# Patient Record
Sex: Female | Born: 1965 | Race: Black or African American | Hispanic: No | Marital: Single | Smoking: Never smoker
Health system: Southern US, Community
[De-identification: ages and names within clinical notes are randomized; demographics above are authoritative.]

## PROBLEM LIST (undated history)

## (undated) DIAGNOSIS — N390 Urinary tract infection, site not specified: Secondary | ICD-10-CM

## (undated) DIAGNOSIS — R519 Headache, unspecified: Secondary | ICD-10-CM

## (undated) DIAGNOSIS — R51 Headache: Secondary | ICD-10-CM

## (undated) DIAGNOSIS — D649 Anemia, unspecified: Secondary | ICD-10-CM

## (undated) HISTORY — PX: BREAST SURGERY: SHX581

## (undated) HISTORY — PX: MOUTH SURGERY: SHX715

---

## 2013-04-11 ENCOUNTER — Emergency Department (HOSPITAL_COMMUNITY)
Admission: EM | Admit: 2013-04-11 | Discharge: 2013-04-11 | Disposition: A | Discharge status: Home / Self Care | Payer: BC Managed Care – PPO | Source: Home / Self Care | Attending: Family Medicine | Admitting: Family Medicine

## 2013-04-11 ENCOUNTER — Encounter (HOSPITAL_COMMUNITY): Payer: Self-pay | Admitting: Emergency Medicine

## 2013-04-11 DIAGNOSIS — N390 Urinary tract infection, site not specified: Secondary | ICD-10-CM

## 2013-04-11 LAB — POCT URINALYSIS DIP (DEVICE)
Bilirubin Urine: NEGATIVE
Glucose, UA: NEGATIVE mg/dL
Ketones, ur: NEGATIVE mg/dL
Nitrite: NEGATIVE
Protein, ur: >=300 mg/dL — AB
Specific Gravity, Urine: >=1.03 (ref 1.005–1.030)
Urobilinogen, UA: 0.2 mg/dL (ref 0.0–1.0)
pH: 7 (ref 5.0–8.0)

## 2013-04-11 MED ORDER — CEPHALEXIN 500 MG PO CAPS: 500.0000 mg | ORAL_CAPSULE | Freq: Four times a day (QID) | ORAL | Status: DC

## 2013-04-11 NOTE — ED Provider Notes (Signed)
CSN: [id number]     Arrival date & time 04/11/13  1836 History   First MD Initiated Contact with Patient 04/11/13 1949     Chief Complaint  Patient presents with  . Urinary Tract Infection    frequency. odor. pain. since yesterday evening.    (Consider location/radiation/quality/duration/timing/severity/associated sxs/prior Treatment) HPI Comments: Pt reports lmp was 1 week ago.   Patient is a 47 y.o. female presenting with urinary tract infection. The history is provided by the patient.  Urinary Tract Infection This is a new problem. The current episode started yesterday. Episode frequency: with urination. The problem has been gradually worsening. Associated symptoms include abdominal pain. Nothing aggravates the symptoms. Nothing relieves the symptoms. Treatments tried: otc azo. The treatment provided no relief.    History reviewed. No pertinent past medical history. History reviewed. No pertinent past surgical history. History reviewed. No pertinent family history. History  Substance Use Topics  . Smoking status: Never Smoker   . Smokeless tobacco: Not on file  . Alcohol Use: Yes   OB History   Grav Para Term Preterm Abortions TAB SAB Ect Mult Living                 Review of Systems  Constitutional: Negative for fever and chills.  Gastrointestinal: Positive for abdominal pain. Negative for nausea and vomiting.  Genitourinary: Positive for dysuria. Negative for hematuria, flank pain and vaginal discharge.    Allergies  Review of patient's allergies indicates no known allergies.  Home Medications   Current Outpatient Rx  Name  Route  Sig  Dispense  Refill  . cephALEXin (KEFLEX) 500 MG capsule   Oral   Take 1 capsule (500 mg total) by mouth 4 (four) times daily.   28 capsule   0    BP 137/90  Pulse 69  Temp(Src) 98.1 F (36.7 C) (Oral)  Resp 14  SpO2 100%  LMP 04/04/2013 Physical Exam  Constitutional: She appears well-developed and well-nourished. No  distress.  Cardiovascular: Normal rate and regular rhythm.   Pulmonary/Chest: Effort normal and breath sounds normal.  Abdominal: Normal appearance and bowel sounds are normal. There is tenderness in the suprapubic area. There is no rigidity, no rebound, no guarding and no CVA tenderness.    ED Course  Procedures (including critical care time) Labs Review Labs Reviewed  POCT URINALYSIS DIP (DEVICE) - Abnormal; Notable for the following:    Hgb urine dipstick MODERATE (*)    Protein, ur >=300 (*)    Leukocytes, UA SMALL (*)    All other components within normal limits  URINE CULTURE   Imaging Review No results found.  MDM   1. UTI (lower urinary tract infection)   pt reports remote hx of uti and requiring more than one course of antibiotics to cure. Ordered urine culture. Rx keflex 500mg  QID for 7 days.      Cathlyn Parsons, NP 04/11/13 [phone]

## 2013-04-11 NOTE — ED Notes (Signed)
C/o uti symptoms. Pain. Frequency. And odor. Onset yesterday evening. Pt has taken azo with no relief.  Denies fever and any other symptoms.

## 2013-04-13 NOTE — ED Provider Notes (Signed)
Medical screening examination/treatment/procedure(s) were performed by resident physician or non-physician practitioner and as supervising physician I was immediately available for consultation/collaboration.   Lorayne Getchell DOUGLAS MD.   Despina Boan D Britany Callicott, MD 04/13/13 1516 

## 2013-05-09 NOTE — ED Notes (Signed)
Patient called , asking for a second round of her antibiotics , as she always needs a 2nd round, the first never works by itself; states she informed the NP of this when she was here ; spoke w Dr Artis Flock , who wished to advise pt to be rechecked or see her regular MD

## 2013-05-16 ENCOUNTER — Emergency Department (HOSPITAL_COMMUNITY)
Admission: EM | Admit: 2013-05-16 | Discharge: 2013-05-16 | Disposition: A | Discharge status: Home / Self Care | Payer: BC Managed Care – PPO | Source: Home / Self Care

## 2013-05-16 ENCOUNTER — Encounter (HOSPITAL_COMMUNITY): Payer: Self-pay | Admitting: Emergency Medicine

## 2013-05-16 DIAGNOSIS — N390 Urinary tract infection, site not specified: Secondary | ICD-10-CM

## 2013-05-16 HISTORY — DX: Urinary tract infection, site not specified: N39.0

## 2013-05-16 LAB — POCT URINALYSIS DIP (DEVICE)
Bilirubin Urine: NEGATIVE
Glucose, UA: NEGATIVE mg/dL
Ketones, ur: NEGATIVE mg/dL
Leukocytes, UA: NEGATIVE
Nitrite: NEGATIVE
Protein, ur: 30 mg/dL — AB
Specific Gravity, Urine: >=1.03 (ref 1.005–1.030)
Urobilinogen, UA: 0.2 mg/dL (ref 0.0–1.0)
pH: 6.5 (ref 5.0–8.0)

## 2013-05-16 MED ORDER — NITROFURANTOIN MONOHYD MACRO 100 MG PO CAPS: 100.0000 mg | ORAL_CAPSULE | Freq: Two times a day (BID) | ORAL | Status: DC

## 2013-05-16 NOTE — ED Provider Notes (Signed)
CSN: [id number]     Arrival date & time 05/16/13  1849 History   None    Chief Complaint  Patient presents with  . Urinary Tract Infection   (Consider location/radiation/quality/duration/timing/severity/associated sxs/prior Treatment) HPI Comments: 47 year old female presents complaining of possible urinary tract infection. She was seen here for this one month ago. She was prescribed Keflex which did help at that time, but the symptoms returned after her course of Keflex. She has burning, urinary frequency, and foul odor to her urine. She has been taking over-the-counter AZO which is helpful. She also has been pushing by mouth fluids which she thinks helps as well. She denies fever, chills, abdominal pain, flank pain. No vaginal discharge or risk factors for STDs. She states that her previous visit, she told the provider of the past she always has to take antibiotics for 2 weeks or the symptoms will return. She has a long history of this. This is been evaluated with ultrasounds as well as voiding cystourethrogram, no causes been found. She was told at the time that she would feel to call back for more antibiotics if she was not better, however, she was denied when she called.  Patient is a 47 y.o. female presenting with urinary tract infection.  Urinary Tract Infection Pertinent negatives include no chest pain, no abdominal pain and no shortness of breath.    Past Medical History  Diagnosis Date  . UTI (lower urinary tract infection)    History reviewed. No pertinent past surgical history. No family history on file. History  Substance Use Topics  . Smoking status: Never Smoker   . Smokeless tobacco: Not on file  . Alcohol Use: 0.0 oz/week    7-14 Glasses of wine per week   OB History   Grav Para Term Preterm Abortions TAB SAB Ect Mult Living                 Review of Systems  Constitutional: Negative for fever and chills.  Eyes: Negative for visual disturbance.  Respiratory:  Negative for cough and shortness of breath.   Cardiovascular: Negative for chest pain, palpitations and leg swelling.  Gastrointestinal: Negative for nausea, vomiting and abdominal pain.  Endocrine: Negative for polydipsia and polyuria.  Genitourinary: Positive for dysuria and frequency. Negative for urgency.       Malodorous urine  Musculoskeletal: Negative for arthralgias and myalgias.  Skin: Negative for rash.  Neurological: Negative for dizziness, weakness and light-headedness.    Allergies  Review of patient's allergies indicates no known allergies.  Home Medications   Current Outpatient Rx  Name  Route  Sig  Dispense  Refill  . cephALEXin (KEFLEX) 500 MG capsule   Oral   Take 1 capsule (500 mg total) by mouth 4 (four) times daily.   28 capsule   0   . nitrofurantoin, macrocrystal-monohydrate, (MACROBID) 100 MG capsule   Oral   Take 1 capsule (100 mg total) by mouth 2 (two) times daily.   28 capsule   0    BP 149/100  Pulse 73  Temp(Src) 98.1 F (36.7 C) (Oral)  Resp 16  SpO2 100%  LMP 05/01/2013 Physical Exam  Nursing note and vitals reviewed. Constitutional: She is oriented to person, place, and time. Vital signs are normal. She appears well-developed and well-nourished. No distress.  HENT:  Head: Normocephalic and atraumatic.  Pulmonary/Chest: Effort normal. No respiratory distress.  Abdominal: Soft. There is no tenderness. There is CVA tenderness (mild, on the left).  Neurological: She  is alert and oriented to person, place, and time. She has normal strength. Coordination normal.  Skin: Skin is warm and dry. No rash noted. She is not diaphoretic.  Psychiatric: She has a normal mood and affect. Judgment normal.    ED Course  Procedures (including critical care time) Labs Review Labs Reviewed  POCT URINALYSIS DIP (DEVICE) - Abnormal; Notable for the following:    Hgb urine dipstick MODERATE (*)    Protein, ur 30 (*)    All other components within normal  limits  URINE CULTURE   Imaging Review No results found.    MDM   1. UTI (lower urinary tract infection)    We'll treat with a prolonged course of Macrobid. Culture sent today. Followup as needed.   Meds ordered this encounter  Medications  . nitrofurantoin, macrocrystal-monohydrate, (MACROBID) 100 MG capsule    Sig: Take 1 capsule (100 mg total) by mouth 2 (two) times daily.    Dispense:  28 capsule    Refill:  0    Order Specific Question:  Supervising Provider    Answer:  Bradd Canary D [[id number]]       Graylon Good, PA-C 05/16/13 1949

## 2013-05-16 NOTE — ED Notes (Signed)
Reports being treated for UTI @ [facility] 9/22 - informed provider that she typically needs a double course of abx for UTI; was told to call back if normal course didn't take care of sxs.  Pt states she called back after sxs improved but didn't completely go away, but the provider working would not call in additional Rx.  C/O slight dysuria, foul odor to urine, frequent urination.  Denies fevers, abd pain, flank pain.  Has been taking AZO.  Completed course of Keflex approx 3 wks ago.

## 2013-05-18 LAB — URINE CULTURE
Colony Count: NO GROWTH
Culture: NO GROWTH
Special Requests: NORMAL

## 2013-05-20 NOTE — ED Provider Notes (Signed)
Medical screening examination/treatment/procedure(s) were performed by resident physician or non-physician practitioner and as supervising physician I was immediately available for consultation/collaboration.   KINDL,JAMES DOUGLAS MD.   James D Kindl, MD 05/20/13 1228 

## 2013-06-29 DIAGNOSIS — R928 Other abnormal and inconclusive findings on diagnostic imaging of breast: Secondary | ICD-10-CM | POA: Insufficient documentation

## 2013-11-22 ENCOUNTER — Encounter (HOSPITAL_COMMUNITY): Payer: Self-pay | Admitting: Emergency Medicine

## 2013-11-22 ENCOUNTER — Emergency Department (HOSPITAL_COMMUNITY): Payer: No Typology Code available for payment source

## 2013-11-22 ENCOUNTER — Emergency Department (HOSPITAL_COMMUNITY)
Admission: EM | Admit: 2013-11-22 | Discharge: 2013-11-22 | Disposition: A | Discharge status: Home / Self Care | Payer: No Typology Code available for payment source | Attending: Emergency Medicine | Admitting: Emergency Medicine

## 2013-11-22 DIAGNOSIS — S93409A Sprain of unspecified ligament of unspecified ankle, initial encounter: Secondary | ICD-10-CM | POA: Insufficient documentation

## 2013-11-22 DIAGNOSIS — Y939 Activity, unspecified: Secondary | ICD-10-CM | POA: Diagnosis not present

## 2013-11-22 DIAGNOSIS — S93401A Sprain of unspecified ligament of right ankle, initial encounter: Secondary | ICD-10-CM

## 2013-11-22 DIAGNOSIS — S99919A Unspecified injury of unspecified ankle, initial encounter: Secondary | ICD-10-CM | POA: Diagnosis present

## 2013-11-22 DIAGNOSIS — S8990XA Unspecified injury of unspecified lower leg, initial encounter: Secondary | ICD-10-CM | POA: Diagnosis present

## 2013-11-22 MED ORDER — TRAMADOL HCL 50 MG PO TABS: 50.0000 mg | ORAL_TABLET | Freq: Four times a day (QID) | ORAL | Status: DC | PRN

## 2013-11-22 MED ORDER — IBUPROFEN 600 MG PO TABS: 600.0000 mg | ORAL_TABLET | Freq: Four times a day (QID) | ORAL | Status: DC | PRN

## 2013-11-22 NOTE — Discharge Instructions (Signed)
Acute Ankle Sprain  with Phase I Rehab  An acute ankle sprain is a partial or complete tear in one or more of the ligaments of the ankle due to traumatic injury. The severity of the injury depends on both the the number of ligaments sprained and the grade of sprain. There are 3 grades of sprains.   · A grade 1 sprain is a mild sprain. There is a slight pull without obvious tearing. There is no loss of strength, and the muscle and ligament are the correct length.  · A grade 2 sprain is a moderate sprain. There is tearing of fibers within the substance of the ligament where it connects two bones or two cartilages. The length of the ligament is increased, and there is usually decreased strength.  · A grade 3 sprain is a complete rupture of the ligament and is uncommon.  In addition to the grade of sprain, there are three types of ankle sprains.   Lateral ankle sprains: This is a sprain of one or more of the three ligaments on the outer side (lateral) of the ankle. These are the most common sprains.  Medial ankle sprains: There is one large triangular ligament of the inner side (medial) of the ankle that is susceptible to injury. Medial ankle sprains are less common.  Syndesmosis, "high ankle," sprains: The syndesmosis is the ligament that connects the two bones of the lower leg. Syndesmosis sprains usually only occur with very severe ankle sprains.  SYMPTOMS  · Pain, tenderness, and swelling in the ankle, starting at the side of injury that may progress to the whole ankle and foot with time.  · "Pop" or tearing sensation at the time of injury.  · Bruising that may spread to the heel.  · Impaired ability to walk soon after injury.  CAUSES   · Acute ankle sprains are caused by trauma placed on the ankle that temporarily forces or pries the anklebone (talus) out of its normal socket.  · Stretching or tearing of the ligaments that normally hold the joint in place (usually due to a twisting injury).  RISK INCREASES  WITH:  · Previous ankle sprain.  · Sports in which the foot may land awkwardly (ie. basketball, volleyball, or soccer) or walking or running on uneven or rough surfaces.  · Shoes with inadequate support to prevent sideways motion when stress occurs.  · Poor strength and flexibility.  · Poor balance skills.  · Contact sports.  PREVENTION   · Warm up and stretch properly before activity.  · Maintain physical fitness:  · Ankle and leg flexibility, muscle strength, and endurance.  · Cardiovascular fitness.  · Balance training activities.  · Use proper technique and have a coach correct improper technique.  · Taping, protective strapping, bracing, or high-top tennis shoes may help prevent injury. Initially, tape is best; however, it loses most of its support function within 10 to 15 minutes.  · Wear proper fitted protective shoes (High-top shoes with taping or bracing is more effective than either alone).  · Provide the ankle with support during sports and practice activities for 12 months following injury.  PROGNOSIS   · If treated properly, ankle sprains can be expected to recover completely; however, the length of recovery depends on the degree of injury.  · A grade 1 sprain usually heals enough in 5 to 7 days to allow modified activity and requires an average of 6 weeks to heal completely.  · A grade 2 sprain requires   6 to 10 weeks to heal completely.  · A grade 3 sprain requires 12 to 16 weeks to heal.  · A syndesmosis sprain often takes more than 3 months to heal.  RELATED COMPLICATIONS   · Frequent recurrence of symptoms may result in a chronic problem. Appropriately addressing the problem the first time decreases the frequency of recurrence and optimizes healing time. Severity of the initial sprain does not predict the likelihood of later instability.  · Injury to other structures (bone, cartilage, or tendon).  · A chronically unstable or arthritic ankle joint is a possiblity with repeated  sprains.  TREATMENT  Treatment initially involves the use of ice, medication, and compression bandages to help reduce pain and inflammation. Ankle sprains are usually immobilized in a walking cast or boot to allow for healing. Crutches may be recommended to reduce pressure on the injury. After immobilization, strengthening and stretching exercises may be necessary to regain strength and a full range of motion. Surgery is rarely needed to treat ankle sprains.  MEDICATION   · Nonsteroidal anti-inflammatory medications, such as aspirin and ibuprofen (do not take for the first 3 days after injury or within 7 days before surgery), or other minor pain relievers, such as acetaminophen, are often recommended. Take these as directed by your caregiver. Contact your caregiver immediately if any bleeding, stomach upset, or signs of an allergic reaction occur from these medications.  · Ointments applied to the skin may be helpful.  · Pain relievers may be prescribed as necessary by your caregiver. Do not take prescription pain medication for longer than 4 to 7 days. Use only as directed and only as much as you need.  HEAT AND COLD  · Cold treatment (icing) is used to relieve pain and reduce inflammation for acute and chronic cases. Cold should be applied for 10 to 15 minutes every 2 to 3 hours for inflammation and pain and immediately after any activity that aggravates your symptoms. Use ice packs or an ice massage.  · Heat treatment may be used before performing stretching and strengthening activities prescribed by your caregiver. Use a heat pack or a warm soak.  SEEK IMMEDIATE MEDICAL CARE IF:   · Pain, swelling, or bruising worsens despite treatment.  · You experience pain, numbness, discoloration, or coldness in the foot or toes.  · New, unexplained symptoms develop (drugs used in treatment may produce side effects.)  EXERCISES   PHASE I EXERCISES  RANGE OF MOTION (ROM) AND STRETCHING EXERCISES - Ankle Sprain, Acute Phase I,  Weeks 1 to 2  These exercises may help you when beginning to restore flexibility in your ankle. You will likely work on these exercises for the 1 to 2 weeks after your injury. Once your physician, physical therapist, or athletic trainer sees adequate progress, he or she will advance your exercises. While completing these exercises, remember:   · Restoring tissue flexibility helps normal motion to return to the joints. This allows healthier, less painful movement and activity.  · An effective stretch should be held for at least 30 seconds.  · A stretch should never be painful. You should only feel a gentle lengthening or release in the stretched tissue.  RANGE OF MOTION - Dorsi/Plantar Flexion  · While sitting with your right / left knee straight, draw the top of your foot upwards by flexing your ankle. Then reverse the motion, pointing your toes downward.  · Hold each position for __________ seconds.  · After completing your first set of   exercises, repeat this exercise with your knee bent.  Repeat __________ times. Complete this exercise __________ times per day.   RANGE OF MOTION - Ankle Alphabet  · Imagine your right / left big toe is a pen.  · Keeping your hip and knee still, write out the entire alphabet with your "pen." Make the letters as large as you can without increasing any discomfort.  Repeat __________ times. Complete this exercise __________ times per day.   STRENGTHENING EXERCISES - Ankle Sprain, Acute -Phase I, Weeks 1 to 2  These exercises may help you when beginning to restore strength in your ankle. You will likely work on these exercises for 1 to 2 weeks after your injury. Once your physician, physical therapist, or athletic trainer sees adequate progress, he or she will advance your exercises. While completing these exercises, remember:   · Muscles can gain both the endurance and the strength needed for everyday activities through controlled exercises.  · Complete these exercises as instructed by  your physician, physical therapist, or athletic trainer. Progress the resistance and repetitions only as guided.  · You may experience muscle soreness or fatigue, but the pain or discomfort you are trying to eliminate should never worsen during these exercises. If this pain does worsen, stop and make certain you are following the directions exactly. If the pain is still present after adjustments, discontinue the exercise until you can discuss the trouble with your clinician.  STRENGTH - Dorsiflexors  · Secure a rubber exercise band/tubing to a fixed object (ie. table, pole) and loop the other end around your right / left foot.  · Sit on the floor facing the fixed object. The band/tubing should be slightly tense when your foot is relaxed.  · Slowly draw your foot back toward you using your ankle and toes.  · Hold this position for __________ seconds. Slowly release the tension in the band and return your foot to the starting position.  Repeat __________ times. Complete this exercise __________ times per day.   STRENGTH - Plantar-flexors   · Sit with your right / left leg extended. Holding onto both ends of a rubber exercise band/tubing, loop it around the ball of your foot. Keep a slight tension in the band.  · Slowly push your toes away from you, pointing them downward.  · Hold this position for __________ seconds. Return slowly, controlling the tension in the band/tubing.  Repeat __________ times. Complete this exercise __________ times per day.   STRENGTH - Ankle Eversion  · Secure one end of a rubber exercise band/tubing to a fixed object (table, pole). Loop the other end around your foot just before your toes.  · Place your fists between your knees. This will focus your strengthening at your ankle.  · Drawing the band/tubing across your opposite foot, slowly, pull your little toe out and up. Make sure the band/tubing is positioned to resist the entire motion.  · Hold this position for __________ seconds.  Have  your muscles resist the band/tubing as it slowly pulls your foot back to the starting position.   Repeat __________ times. Complete this exercise __________ times per day.   STRENGTH - Ankle Inversion  · Secure one end of a rubber exercise band/tubing to a fixed object (table, pole). Loop the other end around your foot just before your toes.  · Place your fists between your knees. This will focus your strengthening at your ankle.  · Slowly, pull your big toe up and in, making   sure the band/tubing is positioned to resist the entire motion.  · Hold this position for __________ seconds.  · Have your muscles resist the band/tubing as it slowly pulls your foot back to the starting position.  Repeat __________ times. Complete this exercises __________ times per day.   STRENGTH - Towel Curls  · Sit in a chair positioned on a non-carpeted surface.  · Place your right / left foot on a towel, keeping your heel on the floor.  · Pull the towel toward your heel by only curling your toes. Keep your heel on the floor.  · If instructed by your physician, physical therapist, or athletic trainer, add weight to the end of the towel.  Repeat __________ times. Complete this exercise __________ times per day.  Document Released: 02/05/2005 Document Revised: 09/29/2011 Document Reviewed: 10/19/2008  [employer]® Patient Information ©2014 [employer].
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
## 2013-11-22 NOTE — ED Notes (Signed)
Pt comfortable with discharge and follow up instructions. Prescriptions x2. 

## 2013-11-23 NOTE — ED Provider Notes (Signed)
Medical screening examination/treatment/procedure(s) were performed by non-physician practitioner and as supervising physician I was immediately available for consultation/collaboration.  Richarda Blade, MD 11/23/13 1550

## 2014-11-28 ENCOUNTER — Other Ambulatory Visit: Payer: Self-pay | Admitting: Gastroenterology

## 2014-11-28 DIAGNOSIS — R109 Unspecified abdominal pain: Secondary | ICD-10-CM

## 2014-12-01 ENCOUNTER — Ambulatory Visit
Admission: RE | Admit: 2014-12-01 | Discharge: 2014-12-01 | Disposition: A | Discharge status: Home / Self Care | Payer: BC Managed Care – PPO | Source: Ambulatory Visit | Attending: Gastroenterology | Admitting: Gastroenterology

## 2014-12-01 DIAGNOSIS — R109 Unspecified abdominal pain: Secondary | ICD-10-CM

## 2015-08-07 ENCOUNTER — Other Ambulatory Visit: Payer: Self-pay | Admitting: Obstetrics and Gynecology

## 2015-08-08 ENCOUNTER — Ambulatory Visit: Payer: BC Managed Care – PPO | Admitting: Neurology

## 2015-08-10 ENCOUNTER — Encounter (HOSPITAL_COMMUNITY)
Admission: RE | Admit: 2015-08-10 | Discharge: 2015-08-10 | Disposition: A | Discharge status: Home / Self Care | Payer: BC Managed Care – PPO | Source: Ambulatory Visit | Attending: Obstetrics and Gynecology | Admitting: Obstetrics and Gynecology

## 2015-08-10 ENCOUNTER — Encounter (HOSPITAL_COMMUNITY): Payer: Self-pay

## 2015-08-10 DIAGNOSIS — Z029 Encounter for administrative examinations, unspecified: Secondary | ICD-10-CM | POA: Diagnosis present

## 2015-08-10 HISTORY — DX: Headache, unspecified: R51.9

## 2015-08-10 HISTORY — DX: Anemia, unspecified: D64.9

## 2015-08-10 HISTORY — DX: Headache: R51

## 2015-08-10 LAB — CBC
HCT: 33.9 % — ABNORMAL LOW (ref 36.0–46.0)
Hemoglobin: 10.8 g/dL — ABNORMAL LOW (ref 12.0–15.0)
MCH: 26.2 pg (ref 26.0–34.0)
MCHC: 31.9 g/dL (ref 30.0–36.0)
MCV: 82.3 fL (ref 78.0–100.0)
Platelets: 267 10*3/uL (ref 150–400)
RBC: 4.12 MIL/uL (ref 3.87–5.11)
RDW: 20.5 % — ABNORMAL HIGH (ref 11.5–15.5)
WBC: 5.7 10*3/uL (ref 4.0–10.5)

## 2015-08-10 NOTE — Patient Instructions (Signed)
Your procedure is scheduled on: August 20, 2015  Enter through the Main Entrance of [facility] at: 8:00 am   Pick up the phone at the desk and dial [phone].  Call this number if you have problems the morning of surgery: [phone].  Remember: Do NOT eat food: after midnight on Sunday  Do NOT drink clear liquids after: midnight on Sunday  Take these medicines the morning of surgery with a SIP OF WATER: none   Do NOT wear jewelry (body piercing), metal hair clips/bobby pins, make-up, or nail polish. Do NOT wear lotions, powders, or perfumes.  You may wear deoderant. Do NOT shave for 48 hours prior to surgery. Do NOT bring valuables to the hospital. Contacts, dentures, or bridgework may not be worn into surgery. Leave suitcase in car.  After surgery it may be brought to your room.  For patients admitted to the hospital, checkout time is 11:00 AM the day of discharge.

## 2015-08-16 ENCOUNTER — Ambulatory Visit: Payer: BC Managed Care – PPO | Admitting: Neurology

## 2015-08-16 ENCOUNTER — Other Ambulatory Visit (HOSPITAL_COMMUNITY): Payer: BC Managed Care – PPO

## 2015-08-17 ENCOUNTER — Encounter: Payer: Self-pay | Admitting: Neurology

## 2015-08-19 NOTE — H&P (Signed)
  Admission History and Physical Exam for a Gynecology Patient  [name] is a 50 y.o. female, No obstetric history on file., who presents for a total abdominal hysterectomy and bilateral salpingectomy. She has been followed at the [facility] and Gynecology division of [facility]. The patient has a history of fibroids.  She has a long history of menorrhagia and anemia.  Ultrasound showed a 14 week size multi-fibroid uterus.  Her Pap smear was normal.  Her gonorrhea and Chlamydia cultures were negative.  Her endometrial biopsy showed benign elements.  Hormonal therapy has not relieved her discomfort.  She is ready to proceed with hysterectomy.  OB History    No data available      Past Medical History  Diagnosis Date  . UTI (lower urinary tract infection)   . Headache     history of   . Anemia     No prescriptions prior to admission    Past Surgical History  Procedure Laterality Date  . Breast surgery      biopsy feb 2014 left breast 2000 right breast   . Mouth surgery      No Known Allergies  Family History: family history is not on file.  Social History:  reports that she has never smoked. She has never used smokeless tobacco. She reports that she drinks alcohol. She reports that she does not use illicit drugs.  Review of systems: See HPI.  Admission Physical Exam:    BMI equals 31.  There were no vitals taken for this visit.  HEENT:                 Within normal limits Chest:                   Clear Heart:                    Regular rate and rhythm Breasts:                No masses, skin changes, bleeding, or discharge present Abdomen:             Nontender, no masses Extremities:          Grossly normal Neurologic exam: Grossly normal  Pelvic exam:  External genitalia: normal general appearance Vaginal: normal without tenderness, induration or masses Cervix: normal appearance Adnexa: non palpable Uterus: 14 week  size, irregular, firm  Assessment:  Fibroid uterus  Menorrhagia  Anemia  Overweight  Plan:  The patient will have a total abdominal hysterectomy and bilateral salpingectomy.  She understands the indications for her surgical procedure.  She accepts the risks of, but not limited to, anesthetic, the case and, bleeding, infections, and possible damage to the surrounding organs.   Eli Hose 08/19/2015

## 2015-08-20 ENCOUNTER — Encounter (HOSPITAL_COMMUNITY)
Admission: RE | Disposition: A | Discharge status: Home / Self Care | Payer: Self-pay | Source: Ambulatory Visit | Attending: Obstetrics and Gynecology

## 2015-08-20 ENCOUNTER — Encounter (HOSPITAL_COMMUNITY): Payer: Self-pay

## 2015-08-20 ENCOUNTER — Inpatient Hospital Stay (HOSPITAL_COMMUNITY): Payer: BC Managed Care – PPO | Admitting: Anesthesiology

## 2015-08-20 ENCOUNTER — Inpatient Hospital Stay (HOSPITAL_COMMUNITY)
Admission: RE | Admit: 2015-08-20 | Discharge: 2015-08-22 | DRG: 743 | Disposition: A | Discharge status: Home / Self Care | Payer: BC Managed Care – PPO | Source: Ambulatory Visit | Attending: Obstetrics and Gynecology | Admitting: Obstetrics and Gynecology

## 2015-08-20 DIAGNOSIS — E663 Overweight: Secondary | ICD-10-CM | POA: Diagnosis present

## 2015-08-20 DIAGNOSIS — N920 Excessive and frequent menstruation with regular cycle: Secondary | ICD-10-CM | POA: Diagnosis present

## 2015-08-20 DIAGNOSIS — D259 Leiomyoma of uterus, unspecified: Principal | ICD-10-CM | POA: Diagnosis present

## 2015-08-20 DIAGNOSIS — D649 Anemia, unspecified: Secondary | ICD-10-CM | POA: Diagnosis present

## 2015-08-20 DIAGNOSIS — D282 Benign neoplasm of uterine tubes and ligaments: Secondary | ICD-10-CM | POA: Diagnosis present

## 2015-08-20 HISTORY — PX: BILATERAL SALPINGECTOMY: SHX5743

## 2015-08-20 HISTORY — PX: ABDOMINAL HYSTERECTOMY: SHX81

## 2015-08-20 HISTORY — PX: CYSTOSCOPY: SHX5120

## 2015-08-20 LAB — PREGNANCY, URINE: Preg Test, Ur: NEGATIVE

## 2015-08-20 SURGERY — HYSTERECTOMY, ABDOMINAL
Anesthesia: General | Site: Urethra | Laterality: Bilateral

## 2015-08-20 MED ORDER — METHYLENE BLUE 1 % INJ SOLN
INTRAMUSCULAR | Status: AC
  Filled 2015-08-20: qty 10

## 2015-08-20 MED ORDER — IBUPROFEN 600 MG PO TABS
600.0000 mg | ORAL_TABLET | Freq: Four times a day (QID) | ORAL | Status: DC | PRN
  Administered 2015-08-21: 600 mg via ORAL
  Filled 2015-08-20: qty 1

## 2015-08-20 MED ORDER — 0.9 % SODIUM CHLORIDE (POUR BTL) OPTIME
TOPICAL | Status: DC | PRN
  Administered 2015-08-20 (×3): 1000 mL

## 2015-08-20 MED ORDER — KETOROLAC TROMETHAMINE 30 MG/ML IJ SOLN
INTRAMUSCULAR | Status: AC
  Filled 2015-08-20: qty 1

## 2015-08-20 MED ORDER — ONDANSETRON HCL 4 MG/2ML IJ SOLN
INTRAMUSCULAR | Status: AC
  Filled 2015-08-20: qty 2

## 2015-08-20 MED ORDER — HYDROMORPHONE HCL 1 MG/ML IJ SOLN
INTRAMUSCULAR | Status: AC
  Filled 2015-08-20: qty 1

## 2015-08-20 MED ORDER — GLYCOPYRROLATE 0.2 MG/ML IJ SOLN
INTRAMUSCULAR | Status: AC
  Filled 2015-08-20: qty 3

## 2015-08-20 MED ORDER — FENTANYL CITRATE (PF) 250 MCG/5ML IJ SOLN
INTRAMUSCULAR | Status: AC
  Filled 2015-08-20: qty 5

## 2015-08-20 MED ORDER — ACETAMINOPHEN 10 MG/ML IV SOLN
1000.0000 mg | Freq: Once | INTRAVENOUS | Status: AC
  Administered 2015-08-20: 1000 mg via INTRAVENOUS
  Filled 2015-08-20: qty 100

## 2015-08-20 MED ORDER — CEFAZOLIN SODIUM-DEXTROSE 2-3 GM-% IV SOLR
INTRAVENOUS | Status: AC
  Filled 2015-08-20: qty 50

## 2015-08-20 MED ORDER — HYDROMORPHONE HCL 1 MG/ML IJ SOLN
INTRAMUSCULAR | Status: DC | PRN
  Administered 2015-08-20 (×2): 0.5 mg via INTRAVENOUS

## 2015-08-20 MED ORDER — LACTATED RINGERS IV SOLN
INTRAVENOUS | Status: DC
  Administered 2015-08-20 (×4): via INTRAVENOUS

## 2015-08-20 MED ORDER — KETOROLAC TROMETHAMINE 30 MG/ML IJ SOLN
30.0000 mg | Freq: Four times a day (QID) | INTRAMUSCULAR | Status: AC
  Administered 2015-08-20 – 2015-08-21 (×3): 30 mg via INTRAVENOUS
  Filled 2015-08-20 (×3): qty 1

## 2015-08-20 MED ORDER — FENTANYL CITRATE (PF) 100 MCG/2ML IJ SOLN
INTRAMUSCULAR | Status: DC | PRN
  Administered 2015-08-20: 100 ug via INTRAVENOUS
  Administered 2015-08-20 (×3): 50 ug via INTRAVENOUS
  Administered 2015-08-20: 100 ug via INTRAVENOUS
  Administered 2015-08-20 (×2): 50 ug via INTRAVENOUS
  Administered 2015-08-20: 100 ug via INTRAVENOUS
  Administered 2015-08-20: 50 ug via INTRAVENOUS

## 2015-08-20 MED ORDER — SODIUM CHLORIDE 0.9% FLUSH: 9.0000 mL | INTRAVENOUS | Status: DC | PRN

## 2015-08-20 MED ORDER — DIPHENHYDRAMINE HCL 50 MG/ML IJ SOLN: 12.5000 mg | Freq: Four times a day (QID) | INTRAMUSCULAR | Status: DC | PRN

## 2015-08-20 MED ORDER — OXYCODONE-ACETAMINOPHEN 5-325 MG PO TABS
1.0000 | ORAL_TABLET | ORAL | Status: DC | PRN
  Administered 2015-08-21 (×2): 2 via ORAL
  Administered 2015-08-21: 1 via ORAL
  Administered 2015-08-21 – 2015-08-22 (×2): 2 via ORAL
  Filled 2015-08-20: qty 1
  Filled 2015-08-20 (×4): qty 2

## 2015-08-20 MED ORDER — METHYLENE BLUE 1 % INJ SOLN
INTRAMUSCULAR | Status: DC | PRN
  Administered 2015-08-20: 8 mL via INTRAVENOUS

## 2015-08-20 MED ORDER — LACTATED RINGERS IV SOLN
INTRAVENOUS | Status: DC
  Administered 2015-08-20 – 2015-08-21 (×3): via INTRAVENOUS

## 2015-08-20 MED ORDER — DIPHENHYDRAMINE HCL 12.5 MG/5ML PO ELIX: 12.5000 mg | ORAL_SOLUTION | Freq: Four times a day (QID) | ORAL | Status: DC | PRN

## 2015-08-20 MED ORDER — VASOPRESSIN 20 UNIT/ML IV SOLN
INTRAVENOUS | Status: AC
  Filled 2015-08-20: qty 1

## 2015-08-20 MED ORDER — SODIUM CHLORIDE 0.9 % IJ SOLN
INTRAMUSCULAR | Status: AC
  Filled 2015-08-20: qty 100

## 2015-08-20 MED ORDER — SCOPOLAMINE 1 MG/3DAYS TD PT72
1.0000 | MEDICATED_PATCH | Freq: Once | TRANSDERMAL | Status: DC
  Administered 2015-08-20: 1.5 mg via TRANSDERMAL

## 2015-08-20 MED ORDER — MIDAZOLAM HCL 2 MG/2ML IJ SOLN
INTRAMUSCULAR | Status: DC | PRN
  Administered 2015-08-20: 2 mg via INTRAVENOUS

## 2015-08-20 MED ORDER — CEFAZOLIN SODIUM-DEXTROSE 2-3 GM-% IV SOLR
2.0000 g | INTRAVENOUS | Status: AC
  Administered 2015-08-20: 2 g via INTRAVENOUS

## 2015-08-20 MED ORDER — MENTHOL 3 MG MT LOZG
1.0000 | LOZENGE | OROMUCOSAL | Status: DC | PRN
Start: 2015-08-20 — End: 2015-08-22

## 2015-08-20 MED ORDER — GLYCOPYRROLATE 0.2 MG/ML IJ SOLN
INTRAMUSCULAR | Status: DC | PRN
  Administered 2015-08-20: 0.6 mg via INTRAVENOUS

## 2015-08-20 MED ORDER — HYDROMORPHONE HCL 1 MG/ML IJ SOLN
0.2500 mg | INTRAMUSCULAR | Status: DC | PRN
  Administered 2015-08-20 (×2): 0.25 mg via INTRAVENOUS

## 2015-08-20 MED ORDER — LIDOCAINE HCL (CARDIAC) 20 MG/ML IV SOLN
INTRAVENOUS | Status: AC
  Filled 2015-08-20: qty 5

## 2015-08-20 MED ORDER — HYDROMORPHONE 1 MG/ML IV SOLN
INTRAVENOUS | Status: DC
  Administered 2015-08-20: 15:00:00 via INTRAVENOUS
  Administered 2015-08-20: 2.7 mg via INTRAVENOUS
  Administered 2015-08-20: 3.6 mg via INTRAVENOUS
  Administered 2015-08-21: 3.9 mg via INTRAVENOUS
  Administered 2015-08-21: 16.2 mL via INTRAVENOUS
  Administered 2015-08-21: 3.3 mg via INTRAVENOUS
  Filled 2015-08-20: qty 25

## 2015-08-20 MED ORDER — PROPOFOL 10 MG/ML IV BOLUS
INTRAVENOUS | Status: AC
  Filled 2015-08-20: qty 20

## 2015-08-20 MED ORDER — ROCURONIUM BROMIDE 100 MG/10ML IV SOLN
INTRAVENOUS | Status: AC
  Filled 2015-08-20: qty 1

## 2015-08-20 MED ORDER — ONDANSETRON HCL 4 MG/2ML IJ SOLN
INTRAMUSCULAR | Status: DC | PRN
  Administered 2015-08-20: 4 mg via INTRAVENOUS

## 2015-08-20 MED ORDER — BUPIVACAINE-EPINEPHRINE 0.5% -1:200000 IJ SOLN
INTRAMUSCULAR | Status: DC | PRN
  Administered 2015-08-20: 10 mL

## 2015-08-20 MED ORDER — LIDOCAINE HCL (CARDIAC) 20 MG/ML IV SOLN
INTRAVENOUS | Status: DC | PRN
  Administered 2015-08-20: 80 mg via INTRAVENOUS

## 2015-08-20 MED ORDER — DEXAMETHASONE SODIUM PHOSPHATE 4 MG/ML IJ SOLN
INTRAMUSCULAR | Status: AC
  Filled 2015-08-20: qty 1

## 2015-08-20 MED ORDER — HYDROMORPHONE HCL 1 MG/ML IJ SOLN
INTRAMUSCULAR | Status: AC
  Administered 2015-08-20: 0.25 mg via INTRAVENOUS
  Filled 2015-08-20: qty 1

## 2015-08-20 MED ORDER — ROCURONIUM BROMIDE 100 MG/10ML IV SOLN
INTRAVENOUS | Status: DC | PRN
  Administered 2015-08-20: 5 mg via INTRAVENOUS
  Administered 2015-08-20: 10 mg via INTRAVENOUS
  Administered 2015-08-20: 50 mg via INTRAVENOUS
  Administered 2015-08-20: 5 mg via INTRAVENOUS

## 2015-08-20 MED ORDER — KETOROLAC TROMETHAMINE 30 MG/ML IJ SOLN
INTRAMUSCULAR | Status: DC | PRN
  Administered 2015-08-20: 30 mg via INTRAVENOUS

## 2015-08-20 MED ORDER — FERROUS SULFATE 325 (65 FE) MG PO TABS
325.0000 mg | ORAL_TABLET | Freq: Three times a day (TID) | ORAL | Status: DC
  Administered 2015-08-21 – 2015-08-22 (×4): 325 mg via ORAL
  Filled 2015-08-20 (×4): qty 1

## 2015-08-20 MED ORDER — FENTANYL CITRATE (PF) 100 MCG/2ML IJ SOLN
INTRAMUSCULAR | Status: AC
  Filled 2015-08-20: qty 2

## 2015-08-20 MED ORDER — NEOSTIGMINE METHYLSULFATE 10 MG/10ML IV SOLN
INTRAVENOUS | Status: DC | PRN
  Administered 2015-08-20: 4 mg via INTRAVENOUS

## 2015-08-20 MED ORDER — STERILE WATER FOR IRRIGATION IR SOLN
Status: DC | PRN
  Administered 2015-08-20: 1000 mL

## 2015-08-20 MED ORDER — MIDAZOLAM HCL 2 MG/2ML IJ SOLN
INTRAMUSCULAR | Status: AC
  Filled 2015-08-20: qty 2

## 2015-08-20 MED ORDER — ONDANSETRON HCL 4 MG/2ML IJ SOLN: 4.0000 mg | Freq: Four times a day (QID) | INTRAMUSCULAR | Status: DC | PRN

## 2015-08-20 MED ORDER — DEXAMETHASONE SODIUM PHOSPHATE 10 MG/ML IJ SOLN
INTRAMUSCULAR | Status: DC | PRN
  Administered 2015-08-20: 4 mg via INTRAVENOUS

## 2015-08-20 MED ORDER — SCOPOLAMINE 1 MG/3DAYS TD PT72
MEDICATED_PATCH | TRANSDERMAL | Status: AC
  Administered 2015-08-20: 1.5 mg via TRANSDERMAL
  Filled 2015-08-20: qty 1

## 2015-08-20 MED ORDER — PROPOFOL 10 MG/ML IV BOLUS
INTRAVENOUS | Status: DC | PRN
  Administered 2015-08-20: 160 mg via INTRAVENOUS

## 2015-08-20 MED ORDER — NALOXONE HCL 0.4 MG/ML IJ SOLN: 0.4000 mg | INTRAMUSCULAR | Status: DC | PRN

## 2015-08-20 MED ORDER — BUPIVACAINE-EPINEPHRINE (PF) 0.5% -1:200000 IJ SOLN
INTRAMUSCULAR | Status: AC
  Filled 2015-08-20: qty 30

## 2015-08-20 SURGICAL SUPPLY — 50 items
CANISTER SUCT 3000ML (MISCELLANEOUS) ×3 IMPLANT
CATH FOLEY 3WAY 30CC 16FR (CATHETERS) IMPLANT
CLOTH BEACON ORANGE TIMEOUT ST (SAFETY) ×3 IMPLANT
CONT PATH 16OZ SNAP LID 3702 (MISCELLANEOUS) ×3 IMPLANT
DECANTER SPIKE VIAL GLASS SM (MISCELLANEOUS) ×3 IMPLANT
DRAPE CESAREAN BIRTH W POUCH (DRAPES) IMPLANT
DRAPE UNDERBUTTOCKS STRL (DRAPE) ×3 IMPLANT
DRAPE WARM FLUID 44X44 (DRAPE) IMPLANT
DRSG OPSITE POSTOP 4X10 (GAUZE/BANDAGES/DRESSINGS) ×3 IMPLANT
DURAPREP 26ML APPLICATOR (WOUND CARE) ×3 IMPLANT
FORMULA 20CAL 3 OZ MEAD (FORMULA) IMPLANT
GAUZE SPONGE 4X4 16PLY XRAY LF (GAUZE/BANDAGES/DRESSINGS) ×3 IMPLANT
GLOVE BIOGEL PI IND STRL 7.0 (GLOVE) ×6 IMPLANT
GLOVE BIOGEL PI IND STRL 8.5 (GLOVE) ×2 IMPLANT
GLOVE BIOGEL PI INDICATOR 7.0 (GLOVE) ×3
GLOVE BIOGEL PI INDICATOR 8.5 (GLOVE) ×1
GLOVE ECLIPSE 8.0 STRL XLNG CF (GLOVE) ×6 IMPLANT
GOWN STRL REUS W/TWL 2XL LVL3 (GOWN DISPOSABLE) ×3 IMPLANT
GOWN STRL REUS W/TWL LRG LVL3 (GOWN DISPOSABLE) ×15 IMPLANT
LEGGING LITHOTOMY PAIR STRL (DRAPES) ×3 IMPLANT
NEEDLE HYPO 18GX1.5 BLUNT FILL (NEEDLE) IMPLANT
NEEDLE HYPO 22GX1.5 SAFETY (NEEDLE) ×3 IMPLANT
NS IRRIG 1000ML POUR BTL (IV SOLUTION) ×9 IMPLANT
PACK ABDOMINAL GYN (CUSTOM PROCEDURE TRAY) ×3 IMPLANT
PAD OB MATERNITY 4.3X12.25 (PERSONAL CARE ITEMS) ×3 IMPLANT
PENCIL SMOKE EVAC W/HOLSTER (ELECTROSURGICAL) ×3 IMPLANT
PLUG CATH AND CAP STER (CATHETERS) IMPLANT
SET CYSTO W/LG BORE CLAMP LF (SET/KITS/TRAYS/PACK) ×3 IMPLANT
SPONGE LAP 18X18 X RAY DECT (DISPOSABLE) ×3 IMPLANT
STAPLER VISISTAT 35W (STAPLE) IMPLANT
SUT CHROMIC 3 0 SH 27 (SUTURE) ×3 IMPLANT
SUT MNCRL AB 3-0 PS2 27 (SUTURE) ×3 IMPLANT
SUT PDS AB 1 CTX 36 (SUTURE) IMPLANT
SUT VIC AB 0 CT1 18XCR BRD8 (SUTURE) ×6 IMPLANT
SUT VIC AB 0 CT1 27 (SUTURE) ×2
SUT VIC AB 0 CT1 27XBRD ANBCTR (SUTURE) ×4 IMPLANT
SUT VIC AB 0 CT1 36 (SUTURE) ×9 IMPLANT
SUT VIC AB 0 CT1 8-18 (SUTURE) ×3
SUT VIC AB 0 CTB1 36 (SUTURE) ×3 IMPLANT
SUT VIC AB 2-0 CT1 27 (SUTURE)
SUT VIC AB 2-0 CT1 TAPERPNT 27 (SUTURE) IMPLANT
SUT VIC AB 3-0 CT1 27 (SUTURE) ×1
SUT VIC AB 3-0 CT1 TAPERPNT 27 (SUTURE) ×2 IMPLANT
SUT VIC AB 4-0 PS2 27 (SUTURE) IMPLANT
SUT VICRYL 0 TIES 12 18 (SUTURE) ×3 IMPLANT
SYR 30ML LL (SYRINGE) ×3 IMPLANT
SYR CONTROL 10ML LL (SYRINGE) ×3 IMPLANT
TOWEL OR 17X24 6PK STRL BLUE (TOWEL DISPOSABLE) ×6 IMPLANT
TRAY FOLEY CATH SILVER 14FR (SET/KITS/TRAYS/PACK) ×3 IMPLANT
WATER STERILE IRR 1000ML POUR (IV SOLUTION) IMPLANT

## 2015-08-20 NOTE — Anesthesia Procedure Notes (Signed)
Procedure Name: Intubation Date/Time: 08/20/2015 10:00 AM Performed by: Raenette Rover Pre-anesthesia Checklist: Patient identified, Emergency Drugs available, Suction available and Patient being monitored Patient Re-evaluated:Patient Re-evaluated prior to inductionOxygen Delivery Method: Circle system utilized Preoxygenation: Pre-oxygenation with 100% oxygen Intubation Type: IV induction Ventilation: Mask ventilation without difficulty Laryngoscope Size: Mac and 3 Grade View: Grade I Tube type: Oral Tube size: 7.0 mm Number of attempts: 1 Airway Equipment and Method: Stylet Placement Confirmation: ETT inserted through vocal cords under direct vision,  positive ETCO2,  CO2 detector and breath sounds checked- equal and bilateral Secured at: 20 cm Tube secured with: Tape Dental Injury: Teeth and Oropharynx as per pre-operative assessment

## 2015-08-20 NOTE — Progress Notes (Signed)
The patient was interviewed and examined today.  The previously documented history and physical examination was reviewed. There are no changes. The operative procedure was reviewed. The risks and benefits were outlined again. The specific risks include, but are not limited to, anesthetic complications, bleeding, infections, and possible damage to the surrounding organs. [name] questions were answered.  We are ready to proceed as outlined. The likelihood of the patient achieving the goals of this procedure is very likely.   BP 136/99 mmHg  Pulse 78  Temp(Src) 98.5 F (36.9 C) (Oral)  Resp 20  SpO2 100%  LMP 05/23/2015  CBC    Component Value Date/Time   WBC 5.7 08/10/2015 0900   RBC 4.12 08/10/2015 0900   HGB 10.8* 08/10/2015 0900   HCT 33.9* 08/10/2015 0900   PLT 267 08/10/2015 0900   MCV 82.3 08/10/2015 0900   MCH 26.2 08/10/2015 0900   MCHC 31.9 08/10/2015 0900   RDW 20.5* 08/10/2015 0900   Gildardo Cranker, M.D.

## 2015-08-20 NOTE — Op Note (Signed)
OPERATIVE NOTE  Katrina Tucker  DOB:    [birth year]  MRN:    [id number]  CSN:    [id number]  Date of Surgery:  08/20/2015  Preoperative Diagnosis:  14 week size fibroid uterus  Menorrhagia  Anemia  Overweight  Postoperative Diagnosis:  Same  Fibroma of the left fallopian tube  Procedure:  Total abdominal hysterectomy  Bilateral salpingectomy  Cystoscopy  Surgeon:  Leonard Schwartz, M.D.  Assistant:  Henreitta Leber, PA-C  Anesthetic:  General  Disposition:  The patient is a 50 year old who presents with menorrhagia and anemia that has not responded to outpatient management. She wants to proceed with hysterectomy. She understands the indications for her surgical procedure. She accepts the risk of, but not limited to, anesthetic complications, bleeding, infections, and possible damage to the surrounding organs.  Findings:  The uterus weighed 743 g. The right fallopian tube and right ovary were normal. The left fallopian tube contained a 1 cm firm mass consistent with a fibroma. The left ovary was normal. The uterus had multiple fibroids. The bowel appeared normal. Cystoscopy was performed. Blue dye quickly passed through both ureteral orifices. There was no evidence of pathology with the bladder mucosa.  Procedure:  The patient was taken to the operating room where a general anesthetic was given. A timeout was performed confirming the appropriate procedure. [name] abdomen was prepped with DuraPrep. Her perineum and vagina were prepped with Betadine. A Foley catheter was placed in the bladder. The patient was sterilely draped. [name] lower abdomen was injected with 10 cc of half percent Marcaine with epinephrine. A low transverse incision was made in the lower abdomen and carried sharply through the subcutaneous tissue, the fascia, and the anterior peritoneum. The pelvis was explored with findings as mentioned above. The uterus was elevated into the  operative field.The upper pedicles were clamped. The round ligament was identified on the right. The round ligament was suture ligated and cut. The right adnexa was then isolated, doubly clamped, cut, free tied, and suture ligated. An identical procedure was carried out on the opposite side. The bladder flap was developed anteriorly. Alternating from left to right, the uterine arteries, and parametrial tissues were clamped and cut. The fundus of the uterus was transected from the cervix because of the large fibroids with the intention of better visualization. The  paracervical tissues, uterosacral ligaments, and the vaginal angles were clamped, cut, sutured, and tied securely. The cervix was transected from the apex of the vagina. The vaginal cuff was closed using figure-of-eight sutures. The pelvis was irrigated. Hemostasis was confirmed. The right fallopian tube was identified. The mesosalpinx was clamped and cut. The defect was closed using a figure-of-eight suture. An identical procedure was carried out on the left. The pelvis was again irrigated. Hemostasis was confirmed. All instruments and packs were removed from the abdominal cavity. The anterior peritoneum and the abdominal musculature were reapproximated in the midline using figure-of-eight sutures. The abdominal musculature and the fascia were irrigated. Hemostasis was adequate. The fascia was closed using a running suture followed by several interrupted sutures. The subcutaneous layer was closed using interrupted sutures. The skin was reapproximated using a subcuticular suture of 3-0 Monocryl. 0 Vicryl is the suture material used except where otherwise mentioned. A sterile dressing was applied.  The patient was placed in a lithotomy position. The Foley catheter was removed. The perineum was prepped with Betadine and then sterilely draped. The patient was given methylene blue. The cystoscope was inserted into the bladder.  Blue dye quickly passed through  both ureteral orifices. The bladder mucosa was thought to be normal. The cystoscope was removed. Another Foley catheter was inserted. The patient was returned to the supine position.  Sponge, needle, and instrument counts were correct on 2 occasions. The estimated blood loss for the procedure was 500 cc. The patient tolerated her procedure well. She was awakened from her anesthetic without difficulty. She was transported to the recovery room in stable condition. She was noted to drain clear green urine. Pathology specimens included: Uterus, cervix, and fallopian tubes.   Leonard Schwartz, M.D.  08/20/2015

## 2015-08-20 NOTE — Transfer of Care (Signed)
Immediate Anesthesia Transfer of Care Note  Patient: Katrina Tucker  Procedure(s) Performed: Procedure(s): TOTAL ABDOMINAL HYSTERECTOMY  (Bilateral) BILATERAL SALPINGECTOMY (Bilateral) CYSTOSCOPY  Patient Location: PACU  Anesthesia Type:General  Level of Consciousness: awake, alert , oriented and patient cooperative  Airway & Oxygen Therapy: Patient Spontanous Breathing and Patient connected to nasal cannula oxygen  Post-op Assessment: Report given to RN and Post -op Vital signs reviewed and stable  Post vital signs: Reviewed and stable  Last Vitals:  Filed Vitals:   08/20/15 0834  BP: 136/99  Pulse: 78  Temp: 36.9 C  Resp: 20    Complications: No apparent anesthesia complications

## 2015-08-20 NOTE — Anesthesia Postprocedure Evaluation (Signed)
Anesthesia Post Note  Patient: Katrina Tucker  Procedure(s) Performed: Procedure(s) (LRB): TOTAL ABDOMINAL HYSTERECTOMY  (Bilateral) BILATERAL SALPINGECTOMY (Bilateral) CYSTOSCOPY  Patient location during evaluation: PACU Anesthesia Type: General Level of consciousness: awake and alert Pain management: pain level controlled Vital Signs Assessment: post-procedure vital signs reviewed and stable Respiratory status: spontaneous breathing, nonlabored ventilation, respiratory function stable and patient connected to nasal cannula oxygen Cardiovascular status: blood pressure returned to baseline and stable Postop Assessment: no signs of nausea or vomiting Anesthetic complications: no    Last Vitals:  Filed Vitals:   08/20/15 1330 08/20/15 1345  BP: 158/98 146/95  Pulse: 77 63  Temp:    Resp: 18 17    Last Pain:  Filed Vitals:   08/20/15 1348  PainSc: Advance [name]

## 2015-08-20 NOTE — Progress Notes (Signed)
Subjective: Patient reports incisional pain.    Objective: I have reviewed patient's vital signs and intake and output.  General: alert and no distress Resp: clear to auscultation bilaterally Cardio: regular rate and rhythm, S1, S2 normal, no murmur, click, rub or gallop GI: soft, non-tender; bowel sounds normal; no masses,  no organomegaly   Assessment/Plan: Doing Well Post Op.   LOS: 0 days    Katrina Tucker V 08/20/2015, 7:40 PM

## 2015-08-20 NOTE — Anesthesia Preprocedure Evaluation (Signed)
Anesthesia Evaluation  Patient identified by MRN, date of birth, ID band Patient awake    Reviewed: Allergy & Precautions, H&P , NPO status , Patient's Chart, lab work & pertinent test results  Airway Mallampati: II  TM Distance: >3 FB Neck ROM: full    Dental no notable dental hx. (+) Dental Advisory Given, Teeth Intact   Pulmonary neg pulmonary ROS,    Pulmonary exam normal breath sounds clear to auscultation       Cardiovascular Exercise Tolerance: Good negative cardio ROS Normal cardiovascular exam Rhythm:regular Rate:Normal     Neuro/Psych negative neurological ROS  negative psych ROS   GI/Hepatic negative GI ROS, Neg liver ROS,   Endo/Other  negative endocrine ROS  Renal/GU negative Renal ROS  negative genitourinary   Musculoskeletal   Abdominal   Peds  Hematology negative hematology ROS (+) anemia ,   Anesthesia Other Findings   Reproductive/Obstetrics negative OB ROS                             Anesthesia Physical Anesthesia Plan  ASA: I  Anesthesia Plan: General   Post-op Pain Management:    Induction: Intravenous  Airway Management Planned: Oral ETT  Additional Equipment:   Intra-op Plan:   Post-operative Plan: Extubation in OR  Informed Consent: I have reviewed the patients History and Physical, chart, labs and discussed the procedure including the risks, benefits and alternatives for the proposed anesthesia with the patient or authorized representative who has indicated his/her understanding and acceptance.   Dental Advisory Given  Plan Discussed with: CRNA and Surgeon  Anesthesia Plan Comments:         Anesthesia Quick Evaluation

## 2015-08-21 ENCOUNTER — Encounter (HOSPITAL_COMMUNITY): Payer: Self-pay | Admitting: Obstetrics and Gynecology

## 2015-08-21 LAB — CBC
HCT: 27.6 % — ABNORMAL LOW (ref 36.0–46.0)
Hemoglobin: 9 g/dL — ABNORMAL LOW (ref 12.0–15.0)
MCH: 27.4 pg (ref 26.0–34.0)
MCHC: 32.6 g/dL (ref 30.0–36.0)
MCV: 84.1 fL (ref 78.0–100.0)
Platelets: 208 10*3/uL (ref 150–400)
RBC: 3.28 MIL/uL — ABNORMAL LOW (ref 3.87–5.11)
RDW: 18 % — ABNORMAL HIGH (ref 11.5–15.5)
WBC: 10.6 10*3/uL — ABNORMAL HIGH (ref 4.0–10.5)

## 2015-08-21 NOTE — Progress Notes (Signed)
Subjective: Patient reports incisional pain and tolerating PO.    Objective:  BP 106/64 mmHg  Pulse 64  Temp(Src) 98.1 F (36.7 C) (Oral)  Resp 13  Ht 5\' 6"  (1.676 m)  Wt 193 lb (87.544 kg)  BMI 31.17 kg/m2  SpO2 100%  LMP 05/23/2015  Results for orders placed or performed during the hospital encounter of 08/20/15 (from the past 24 hour(s))  Pregnancy, urine     Status: None   Collection Time: 08/20/15  9:20 AM  Result Value Ref Range   Preg Test, Ur NEGATIVE NEGATIVE  CBC     Status: Abnormal   Collection Time: 08/21/15  6:07 AM  Result Value Ref Range   WBC 10.6 (H) 4.0 - 10.5 K/uL   RBC 3.28 (L) 3.87 - 5.11 MIL/uL   Hemoglobin 9.0 (L) 12.0 - 15.0 g/dL   HCT 27.6 (L) 36.0 - 46.0 %   MCV 84.1 78.0 - 100.0 fL   MCH 27.4 26.0 - 34.0 pg   MCHC 32.6 30.0 - 36.0 g/dL   RDW 18.0 (H) 11.5 - 15.5 %   Platelets 208 150 - 400 K/uL   I have reviewed patient's vital signs, intake and output and labs.  General: alert and no distress Resp: clear to auscultation bilaterally Cardio: regular rate and rhythm, S1, S2 normal, no murmur, click, rub or gallop GI: soft and clean dressing. Extremities: no edema, redness or tenderness in the calves or thighs   Assessment/Plan:  Doing well.  Anemia. Txn discussed. R and B reviewed. Declined for now.  Home tomorrow.    LOS: 1 day    Celedonio Sortino V 08/21/2015, 8:00 AM

## 2015-08-22 MED ORDER — OXYCODONE-ACETAMINOPHEN 5-325 MG PO TABS: 1.0000 | ORAL_TABLET | ORAL | Status: DC | PRN

## 2015-08-22 MED ORDER — IBUPROFEN 800 MG PO TABS: 800.0000 mg | ORAL_TABLET | Freq: Three times a day (TID) | ORAL | Status: DC | PRN

## 2015-08-22 MED ORDER — DOCUSATE SODIUM 100 MG PO CAPS: 100.0000 mg | ORAL_CAPSULE | Freq: Two times a day (BID) | ORAL | Status: DC

## 2015-08-22 NOTE — Discharge Summary (Signed)
Physician Discharge Summary  Patient ID: Katrina Tucker MRN: [id number] DOB/AGE: [birth year] 50 y.o.  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 She has a history of  Fibroids, Anemia, and menorrhagia. She is ready to proceed with definitive therapy. Please see her documented history and physical exam. Her preoperative hemoglobin was 10.8.  Hospital course:  On the day of admission, the patient underwent the following Procedure(s): TOTAL ABDOMINAL HYSTERECTOMY  BILATERAL SALPINGECTOMY CYSTOSCOPY. Operative findings included a 743 g multi-fibroid uterus. There was a firm nodule present in the left fallopian tube. The bladder mucosa was noted to be normal. Dye quickly passed through both ureteral openings. Her postoperative course was uneventful. She quickly tolerated a regular diet. Her postoperative pain was controlled with oral medication. She remained afebrile. Today she was felt to be ready for discharge.  Labs:  HEMOGLOBIN  Date Value Ref Range Status  08/21/2015 9.0* 12.0 - 15.0 g/dL Final   HCT  Date Value Ref Range Status  08/21/2015 27.6* 36.0 - 46.0 % Final    Consults: None  Final pathology report:  Benign fibroids, endometrium, and cervix. 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  Disposition:  The patient will be discharged to home. She has been given a copy of the discharge instructions as prepared by the [facility] for patients who have  undergone the Procedure(s): TOTAL ABDOMINAL HYSTERECTOMY  BILATERAL SALPINGECTOMY CYSTOSCOPY.      Medication List    STOP taking these medications        ALEVE 220 MG tablet  Generic drug:  naproxen sodium     medroxyPROGESTERone 10 MG tablet  Commonly known as:  PROVERA      TAKE these medications        docusate sodium 100 MG capsule  Commonly known as:  COLACE  Take 1 capsule (100 mg total) by mouth 2 (two) times daily.     FISH OIL PO  Take 1 capsule by mouth daily.     ibuprofen 800 MG tablet  Commonly known as:  ADVIL,MOTRIN  Take 1 tablet (800 mg total) by mouth every 8 (eight) hours as needed.     IRON SUPPLEMENT 325 (65 FE) MG tablet  Generic drug:  ferrous sulfate  Take 325 mg by mouth 3 (three) times daily with meals.     multivitamin with minerals Tabs tablet  Take 1 tablet by mouth daily.     oxyCODONE-acetaminophen 5-325 MG tablet  Commonly known as:  ROXICET  Take 1 tablet by mouth every 4 (four) hours as needed for severe pain.     Vitamin D (Ergocalciferol) 50000 units Caps capsule  Commonly known as:  DRISDOL  Take 50,000 Units by mouth 2 (two) times a week.           Follow-up Information    Follow up with Janine Limbo, MD In 6 weeks.   Specialty:  Obstetrics and Gynecology   Contact information:   [address] [place] [address] [phone]  SignedJanine Limbo 08/22/2015, 7:34 AM

## 2015-08-22 NOTE — Discharge Instructions (Signed)
Anemia, Nonspecific Anemia is a condition in which the concentration of red blood cells or hemoglobin in the blood is below normal. Hemoglobin is a substance in red blood cells that carries oxygen to the tissues of the body. Anemia results in not enough oxygen reaching these tissues.  CAUSES  Common causes of anemia include:   Excessive bleeding. Bleeding may be internal or external. This includes excessive bleeding from periods (in women) or from the intestine.   Poor nutrition.   Chronic kidney, thyroid, and liver disease.  Bone marrow disorders that decrease red blood cell production.  Cancer and treatments for cancer.  HIV, AIDS, and their treatments.  Spleen problems that increase red blood cell destruction.  Blood disorders.  Excess destruction of red blood cells due to infection, medicines, and autoimmune disorders. SIGNS AND SYMPTOMS   Minor weakness.   Dizziness.   Headache.  Palpitations.   Shortness of breath, especially with exercise.   Paleness.  Cold sensitivity.  Indigestion.  Nausea.  Difficulty sleeping.  Difficulty concentrating. Symptoms may occur suddenly or they may develop slowly.  DIAGNOSIS  Additional blood tests are often needed. These help your health care provider determine the best treatment. Your health care provider will check your stool for blood and look for other causes of blood loss.  TREATMENT  Treatment varies depending on the cause of the anemia. Treatment can include:   Supplements of iron, vitamin 123456, or folic acid.   Hormone medicines.   A blood transfusion. This may be needed if blood loss is severe.   Hospitalization. This may be needed if there is significant continual blood loss.   Dietary changes.  Spleen removal. HOME CARE INSTRUCTIONS Keep all follow-up appointments. It often takes many weeks to correct anemia, and having your health care provider check on your condition and your response to  treatment is very important. SEEK IMMEDIATE MEDICAL CARE IF:   You develop extreme weakness, shortness of breath, or chest pain.   You become dizzy or have trouble concentrating.  You develop heavy vaginal bleeding.   You develop a rash.   You have bloody or black, tarry stools.   You faint.   You vomit up blood.   You vomit repeatedly.   You have abdominal pain.  You have a fever or persistent symptoms for more than 2-3 days.   You have a fever and your symptoms suddenly get worse.   You are dehydrated.  MAKE SURE YOU:  Understand these instructions.  Will watch your condition.  Will get help right away if you are not doing well or get worse.   This information is not intended to replace advice given to you by your health care provider. Make sure you discuss any questions you have with your health care provider.   Document Released: 08/14/2004 Document Revised: 03/09/2013 Document Reviewed: 12/31/2012 Elsevier Interactive Patient Education 2016 [place]. Abdominal Hysterectomy, Care After These instructions give you information on caring for yourself after your procedure. Your doctor may also give you more specific instructions. Call your doctor if you have any problems or questions after your procedure.  HOME CARE It takes 4-6 weeks to recover from this surgery. Follow all of your doctor's instructions.   Only take medicines as told by your doctor.  Change your bandage as told by your doctor.  Return to your doctor to have your stitches taken out.  Take showers for 2-3 weeks. Ask your doctor when it is okay to shower.  Do not  douche, use tampons, or have sex (intercourse) for at least 6 weeks or as told.  Follow your doctor's advice about exercise, lifting objects, driving, and general activities.  Get plenty of rest and sleep.  Do not lift anything heavier than a gallon of milk (about 10 pounds [4.5 kilograms]) for the first month after  surgery.  Get back to your normal diet as told by your doctor.  Do not drink alcohol until your doctor says it is okay.  Take a medicine to help you poop (laxative) as told by your doctor.  Eating foods high in fiber may help you poop. Eat a lot of raw fruits and vegetables, whole grains, and beans.  Drink enough fluids to keep your pee (urine) clear or pale yellow.  Have someone help you at home for 1-2 weeks after your surgery.  Keep follow-up doctor visits as told. GET HELP IF:  You have chills or fever.  You have puffiness, redness, or pain in area of the cut (incision).  You have yellowish-white fluid (pus) coming from the cut.  You have a bad smell coming from the cut or bandage.  Your cut pulls apart.  You feel dizzy or light-headed.  You have pain or bleeding when you pee.  You keep having watery poop (diarrhea).  You keep feeling sick to your stomach (nauseous) or keep throwing up (vomiting).  You have fluid (discharge) coming from your vagina.  You have a rash.  You have a reaction to your medicine.  You need stronger pain medicine. GET HELP RIGHT AWAY IF:   You have a fever and your symptoms suddenly get worse.  You have bad belly (abdominal) pain.  You have chest pain.  You are short of breath.  You pass out (faint).  You have pain, puffiness, or redness of your leg.  You bleed a lot from your vagina and notice clumps of tissue (clots). MAKE SURE YOU:   Understand these instructions.  Will watch your condition.  Will get help right away if you are not doing well or get worse.   This information is not intended to replace advice given to you by your health care provider. Make sure you discuss any questions you have with your health care provider.   Document Released: 04/15/2008 Document Revised: 07/12/2013 Document Reviewed: 04/29/2013 Elsevier Interactive Patient Education [employer].

## 2015-08-22 NOTE — Progress Notes (Signed)
Pt verbalizes understanding of d/c instructions, medications, follow up appts (which she has already made), belongings policy, and when to seek medical attention. Pt has no questions at this time. IV was just removed by NT without complications. Pts SO is at bedside asleep. Pt states she is going to wake him up and decide what time they are leaving. Marry Guan '

## 2015-08-22 NOTE — Progress Notes (Signed)
Pt d/c to the main entrance via wheelchair at this time. Pt was accompanied by NT and by her SO who will be driving her home and assisting her with ADLs around the house for now. Katrina Tucker

## 2015-09-28 ENCOUNTER — Ambulatory Visit: Payer: BC Managed Care – PPO | Admitting: Neurology

## 2015-10-18 ENCOUNTER — Telehealth: Payer: Self-pay | Admitting: Neurology

## 2015-10-18 ENCOUNTER — Ambulatory Visit: Payer: BC Managed Care – PPO | Admitting: Neurology

## 2015-10-18 NOTE — Telephone Encounter (Signed)
Patient called 12:44pm to advise she is running late for 1:00pm new patient appointment with Dr. Jaynee Eagles. Patient is in [place], states it will take approximately 15-20 minutes to get to our office. Patient advised appointment time is 1:00pm, was previously instructed to arrive 12:30pm, protocol is, if a patient arrives up to 10 minutes late we will still see them, if 10-15 minutes late we will send a message to their nurse and it will be up to the nurse and Doctor if we will be able to see them. Patient doesn't want to come all the way from [facility] if we are going to end up not seeing her. Patient advised of $35 fee to cancel < 24 hrs or no show appointment. This will be the second time appointment will have been cancelled < 24 hours. Patient opts to pay $35 fee and reschedule. Transferred patient to billing and insurance to pay fee.

## 2015-10-18 NOTE — Telephone Encounter (Signed)
Katrina Tucker spoke to pt. Pt is discharged from practice for 2 new patient no show.

## 2015-10-19 ENCOUNTER — Encounter: Payer: Self-pay | Admitting: Neurology

## 2017-04-13 ENCOUNTER — Encounter: Payer: BC Managed Care – PPO | Admitting: Vascular Surgery

## 2017-04-20 ENCOUNTER — Encounter: Payer: BC Managed Care – PPO | Admitting: Vascular Surgery

## 2017-08-10 ENCOUNTER — Encounter: Payer: BC Managed Care – PPO | Admitting: Vascular Surgery

## 2017-09-15 ENCOUNTER — Encounter: Payer: BC Managed Care – PPO | Admitting: Vascular Surgery

## 2017-10-20 ENCOUNTER — Encounter: Payer: BC Managed Care – PPO | Admitting: Vascular Surgery

## 2017-11-12 DIAGNOSIS — N644 Mastodynia: Secondary | ICD-10-CM | POA: Insufficient documentation

## 2017-12-15 ENCOUNTER — Encounter: Payer: BC Managed Care – PPO | Admitting: Vascular Surgery

## 2019-01-27 ENCOUNTER — Ambulatory Visit: Payer: BC Managed Care – PPO | Admitting: Nurse Practitioner

## 2019-01-27 ENCOUNTER — Encounter: Payer: Self-pay | Admitting: Nurse Practitioner

## 2019-01-27 ENCOUNTER — Other Ambulatory Visit: Payer: Self-pay

## 2019-01-27 VITALS — BP 122/80 | HR 82 | Temp 98.2°F | Ht 65.8 in | Wt 183.0 lb

## 2019-01-27 DIAGNOSIS — R2243 Localized swelling, mass and lump, lower limb, bilateral: Secondary | ICD-10-CM

## 2019-01-27 DIAGNOSIS — M79602 Pain in left arm: Secondary | ICD-10-CM

## 2019-01-27 DIAGNOSIS — R350 Frequency of micturition: Secondary | ICD-10-CM | POA: Diagnosis not present

## 2019-01-27 DIAGNOSIS — R2232 Localized swelling, mass and lump, left upper limb: Secondary | ICD-10-CM

## 2019-01-27 DIAGNOSIS — R229 Localized swelling, mass and lump, unspecified: Secondary | ICD-10-CM | POA: Insufficient documentation

## 2019-01-27 DIAGNOSIS — R3129 Other microscopic hematuria: Secondary | ICD-10-CM

## 2019-01-27 DIAGNOSIS — R300 Dysuria: Secondary | ICD-10-CM | POA: Diagnosis not present

## 2019-01-27 DIAGNOSIS — N644 Mastodynia: Secondary | ICD-10-CM

## 2019-01-27 DIAGNOSIS — Z803 Family history of malignant neoplasm of breast: Secondary | ICD-10-CM

## 2019-01-27 LAB — POCT URINALYSIS DIPSTICK
Bilirubin, UA: NEGATIVE
Blood, UA: POSITIVE
Glucose, UA: NEGATIVE
Ketones, UA: NEGATIVE
Leukocytes, UA: NEGATIVE
Nitrite, UA: NEGATIVE
Protein, UA: POSITIVE — AB
Spec Grav, UA: 1.025 (ref 1.010–1.025)
Urobilinogen, UA: 0.2 E.U./dL
pH, UA: 5.5 (ref 5.0–8.0)

## 2019-01-27 MED ORDER — CIPROFLOXACIN HCL 500 MG PO TABS: 500.0000 mg | ORAL_TABLET | Freq: Two times a day (BID) | ORAL | 0 refills | Status: AC

## 2019-01-27 NOTE — Progress Notes (Signed)
Subjective:     Patient ID: Katrina Tucker , female    DOB: 1965-08-03 , 52 y.o.   MRN: [id number]   Chief Complaint  Patient presents with  . breast exam    patient states she needs a referral for a diagnostic and mammogram  . Dysuria    patient states she feels some pressure and frequency in urinating  . Arm Pain    patient states she has some pain on the back of her left upper arm    HPI  2 month history of left arm pain radiating from left breast.  Negative discharge from breast.  She has a family history of breast cancer mother (survivor) and maternal grandmother (deceased).  Her mother took the BRCA test.    Dysuria  This is a chronic problem. The current episode started more than 1 year ago. The quality of the pain is described as aching. She is not sexually active. Associated symptoms include frequency. Pertinent negatives include no chills. Her past medical history is significant for recurrent UTIs.  Arm Pain   Urinary Frequency  This is a new problem. The current episode started more than 1 month ago. The problem occurs intermittently. The problem has been unchanged. Quality: pressure to bladder, has seen a urologist in the past. There has been no fever. She is not sexually active. There is no history of pyelonephritis. Associated symptoms include frequency. Pertinent negatives include no chills. Treatments tried: cranberry. The treatment provided no relief. Her past medical history is significant for recurrent UTIs.     Past Medical History:  Diagnosis Date  . Anemia   . Headache    history of   . UTI (lower urinary tract infection)      No family history on file.   Current Outpatient Medications:  Marland Kitchen  Multiple Vitamin (MULTIVITAMIN WITH MINERALS) TABS tablet, Take 1 tablet by mouth daily., Disp: , Rfl:  .  Omega-3 Fatty Acids (FISH OIL PO), Take 1 capsule by mouth daily., Disp: , Rfl:    No Known Allergies   Review of Systems  Constitutional: Negative for chills  and fatigue.  Respiratory: Negative.   Cardiovascular: Negative.   Endocrine: Negative for polydipsia, polyphagia and polyuria.  Genitourinary: Positive for dysuria and frequency.     Today's Vitals   01/27/19 1553  BP: 122/80  Pulse: 82  Temp: 98.2 F (36.8 C)  TempSrc: Oral  Weight: 183 lb (83 kg)  Height: 5' 5.8" (1.671 m)  PainSc: 0-No pain   Body mass index is 29.72 kg/m.   Objective:  Physical Exam Constitutional:      Appearance: Normal appearance.  Cardiovascular:     Rate and Rhythm: Normal rate and regular rhythm.     Pulses: Normal pulses.     Heart sounds: Normal heart sounds. No murmur.  Pulmonary:     Effort: Pulmonary effort is normal. No respiratory distress.     Breath sounds: Normal breath sounds.  Chest:     Chest wall: No mass.     Breasts:        Right: No mass, skin change or tenderness.        Left: Tenderness present. No mass or skin change.       Comments: Pain to palpation Skin:    General: Skin is warm and dry.     Capillary Refill: Capillary refill takes less than 2 seconds.     Comments: Multiple nodules to thighs on palpation and to upper arm left.  Neurological:     General: No focal deficit present.     Mental Status: She is alert and oriented to person, place, and time.  Psychiatric:        Mood and Affect: Mood normal.        Behavior: Behavior normal.        Thought Content: Thought content normal.        Judgment: Judgment normal.         Assessment And Plan:     1. Breast pain, left  Pain to left breast, negative for nodule  Due to family history of breast cancer will refer for diagnostic mammogram - MM Digital Diagnostic Bilat; Future  2. Left arm pain  Radiating pain to left arm  No pain on palpation - MM Digital Diagnostic Bilat; Future  3. Other microscopic hematuria  Will treat for uti due to having symptoms and send urine for culture - Culture, Urine - ciprofloxacin (CIPRO) 500 MG tablet; Take 1  tablet (500 mg total) by mouth 2 (two) times daily for 5 days.  Dispense: 10 tablet; Refill: 0 - CBC - CMP14 + Anion Gap - POCT Urinalysis Dipstick (81002)  4. Multiple skin nodules  Multiple nodules to thighs and upper arms  This has been ongoing for several years will refer to hematology for further evaluation  - Ambulatory referral to Hematology   [name], FNP    THE PATIENT IS ENCOURAGED TO PRACTICE SOCIAL DISTANCING DUE TO THE COVID-19 PANDEMIC.

## 2019-01-28 LAB — CMP14 + ANION GAP
ALT: 29 IU/L (ref 0–32)
AST: 23 IU/L (ref 0–40)
Albumin/Globulin Ratio: 2 (ref 1.2–2.2)
Albumin: 4.6 g/dL (ref 3.8–4.9)
Alkaline Phosphatase: 62 IU/L (ref 39–117)
Anion Gap: 14 mmol/L (ref 10.0–18.0)
BUN/Creatinine Ratio: 12 (ref 9–23)
BUN: 10 mg/dL (ref 6–24)
Bilirubin Total: 0.7 mg/dL (ref 0.0–1.2)
CO2: 24 mmol/L (ref 20–29)
Calcium: 9.3 mg/dL (ref 8.7–10.2)
Chloride: 104 mmol/L (ref 96–106)
Creatinine, Ser: 0.84 mg/dL (ref 0.57–1.00)
GFR calc Af Amer: 90 mL/min/{1.73_m2} (ref >59)
GFR calc non Af Amer: 78 mL/min/{1.73_m2} (ref >59)
Globulin, Total: 2.3 g/dL (ref 1.5–4.5)
Glucose: 69 mg/dL (ref 65–99)
Potassium: 3.7 mmol/L (ref 3.5–5.2)
Sodium: 142 mmol/L (ref 134–144)
Total Protein: 6.9 g/dL (ref 6.0–8.5)

## 2019-01-28 LAB — CBC
Hematocrit: 37 % (ref 34.0–46.6)
Hemoglobin: 13 g/dL (ref 11.1–15.9)
MCH: 30.9 pg (ref 26.6–33.0)
MCHC: 35.1 g/dL (ref 31.5–35.7)
MCV: 88 fL (ref 79–97)
Platelets: 263 10*3/uL (ref 150–450)
RBC: 4.21 x10E6/uL (ref 3.77–5.28)
RDW: 12.9 % (ref 11.7–15.4)
WBC: 6.4 10*3/uL (ref 3.4–10.8)

## 2019-01-28 LAB — URINE CULTURE: Organism ID, Bacteria: NO GROWTH

## 2019-02-11 ENCOUNTER — Other Ambulatory Visit: Payer: Self-pay | Admitting: Nurse Practitioner

## 2019-02-11 ENCOUNTER — Telehealth: Payer: Self-pay

## 2019-02-11 DIAGNOSIS — R229 Localized swelling, mass and lump, unspecified: Secondary | ICD-10-CM

## 2019-02-11 NOTE — Telephone Encounter (Signed)
Patient called requesting labs results and also she wanted to check on the status for her referrals for hemtaology and to get a diagnostic mammogram. I have notified her that you have to review her labs and then I will call her and as for the referral I told her she has to get biopies before going to hematology so I notified her that I think you said you were going to refer her to dermatology and she was ok with that and we also have to put the order in for the diagnostic mammogram. YRL,RMA

## 2019-02-16 ENCOUNTER — Other Ambulatory Visit: Payer: Self-pay | Admitting: Nurse Practitioner

## 2019-02-16 ENCOUNTER — Telehealth: Payer: Self-pay

## 2019-02-16 NOTE — Telephone Encounter (Signed)
Left message for pt to call back for lab results which are normal and to let her know all referrals have been pt in she should wait for calls from those offices

## 2019-02-18 ENCOUNTER — Other Ambulatory Visit: Payer: Self-pay | Admitting: Nurse Practitioner

## 2019-02-18 DIAGNOSIS — Z139 Encounter for screening, unspecified: Secondary | ICD-10-CM

## 2019-03-02 ENCOUNTER — Telehealth: Payer: Self-pay | Admitting: Licensed Clinical Social Worker

## 2019-03-02 NOTE — Telephone Encounter (Signed)
Pt returned my call to schedule a genetic counseling appt. Pt has been scheduled for a webex visit w/Brianna on 8/25 at 11am. Ms. Yam is aware someone will call her to setup the webex visit.

## 2019-03-11 ENCOUNTER — Telehealth: Payer: Self-pay | Admitting: Licensed Clinical Social Worker

## 2019-03-11 NOTE — Telephone Encounter (Signed)
Called patient regarding upcoming Webex appointment, left a voicemail. This will be considered a walk-in visit due to no communication to set this up a virtual visit.

## 2019-03-15 ENCOUNTER — Other Ambulatory Visit: Payer: BC Managed Care – PPO

## 2019-03-15 ENCOUNTER — Inpatient Hospital Stay: Payer: BC Managed Care – PPO | Attending: Genetic Counselor | Admitting: Licensed Clinical Social Worker

## 2019-05-03 ENCOUNTER — Encounter: Payer: Self-pay | Admitting: Nurse Practitioner

## 2019-07-07 ENCOUNTER — Encounter: Payer: BC Managed Care – PPO | Admitting: Nurse Practitioner

## 2019-07-11 ENCOUNTER — Telehealth: Payer: Self-pay

## 2019-07-11 NOTE — Telephone Encounter (Signed)
PT LVM TO [facility] APPT. ATT TO CONTACT PT NO ANS LVM FOR HER TO CALL OFC AT HER CONVENIENCE

## 2019-07-19 ENCOUNTER — Ambulatory Visit: Payer: Self-pay | Admitting: Nurse Practitioner

## 2019-09-01 ENCOUNTER — Telehealth: Payer: Self-pay

## 2019-09-01 NOTE — Telephone Encounter (Signed)
PT LVM TO [facility] 2/15 APPT. ATT TO CONTACT PT TO RESCH NO ANS LVM TO CALL OFC TO [facility]

## 2019-09-05 ENCOUNTER — Ambulatory Visit: Payer: Self-pay | Admitting: Nurse Practitioner

## 2019-12-14 LAB — HEPATIC FUNCTION PANEL
ALT: 23 (ref 7–35)
AST: 20 (ref 13–35)
Alkaline Phosphatase: 85 (ref 25–125)
Bilirubin, Total: 0.7

## 2019-12-14 LAB — COMPREHENSIVE METABOLIC PANEL
Albumin: 4.7 (ref 3.5–5.0)
Calcium: 9.7 (ref 8.7–10.7)
GFR calc Af Amer: 89
GFR calc non Af Amer: 77
Globulin: 2.7

## 2019-12-14 LAB — BASIC METABOLIC PANEL
BUN: 10 (ref 4–21)
CO2: 24 — AB (ref 13–22)
Chloride: 105 (ref 99–108)
Creatinine: 0.9 (ref 0.5–1.1)
Glucose: 82
Potassium: 3.9 (ref 3.4–5.3)
Sodium: 144 (ref 137–147)

## 2019-12-14 LAB — CBC AND DIFFERENTIAL
HCT: 42 (ref 36–46)
Hemoglobin: 13.9 (ref 12.0–16.0)
Neutrophils Absolute: 3.9
Platelets: 277 (ref 150–399)
WBC: 5.9

## 2019-12-14 LAB — CBC: RBC: 4.61 (ref 3.87–5.11)

## 2020-07-04 ENCOUNTER — Encounter: Payer: Self-pay | Admitting: Nurse Practitioner

## 2020-07-25 ENCOUNTER — Encounter: Payer: Self-pay | Admitting: Nurse Practitioner

## 2020-07-25 ENCOUNTER — Other Ambulatory Visit: Payer: Self-pay

## 2020-07-25 ENCOUNTER — Ambulatory Visit (INDEPENDENT_AMBULATORY_CARE_PROVIDER_SITE_OTHER): Payer: Commercial Managed Care - PPO | Admitting: Nurse Practitioner

## 2020-07-25 VITALS — BP 140/82 | HR 68 | Temp 98.3°F | Ht 65.8 in | Wt 188.0 lb

## 2020-07-25 DIAGNOSIS — N644 Mastodynia: Secondary | ICD-10-CM | POA: Diagnosis not present

## 2020-07-25 DIAGNOSIS — E6609 Other obesity due to excess calories: Secondary | ICD-10-CM

## 2020-07-25 DIAGNOSIS — Z139 Encounter for screening, unspecified: Secondary | ICD-10-CM

## 2020-07-25 DIAGNOSIS — Z0001 Encounter for general adult medical examination with abnormal findings: Secondary | ICD-10-CM

## 2020-07-25 DIAGNOSIS — E66811 Other obesity due to excess calories: Secondary | ICD-10-CM

## 2020-07-25 DIAGNOSIS — I779 Disorder of arteries and arterioles, unspecified: Secondary | ICD-10-CM

## 2020-07-25 DIAGNOSIS — R10A0 Flank pain, unspecified side: Secondary | ICD-10-CM

## 2020-07-25 DIAGNOSIS — R229 Localized swelling, mass and lump, unspecified: Secondary | ICD-10-CM | POA: Diagnosis not present

## 2020-07-25 DIAGNOSIS — R109 Unspecified abdominal pain: Secondary | ICD-10-CM | POA: Diagnosis not present

## 2020-07-25 DIAGNOSIS — H6123 Impacted cerumen, bilateral: Secondary | ICD-10-CM

## 2020-07-25 DIAGNOSIS — Z6830 Body mass index (BMI) 30.0-30.9, adult: Secondary | ICD-10-CM

## 2020-07-25 DIAGNOSIS — M79602 Pain in left arm: Secondary | ICD-10-CM

## 2020-07-25 DIAGNOSIS — R319 Hematuria, unspecified: Secondary | ICD-10-CM

## 2020-07-25 DIAGNOSIS — Z87898 Personal history of other specified conditions: Secondary | ICD-10-CM

## 2020-07-25 DIAGNOSIS — Z2821 Immunization not carried out because of patient refusal: Secondary | ICD-10-CM

## 2020-07-25 LAB — POCT URINALYSIS DIPSTICK
Bilirubin, UA: NEGATIVE
Glucose, UA: NEGATIVE
Ketones, UA: NEGATIVE
Leukocytes, UA: NEGATIVE
Nitrite, UA: NEGATIVE
Protein, UA: NEGATIVE
Spec Grav, UA: 1.025 (ref 1.010–1.025)
Urobilinogen, UA: 0.2 E.U./dL
pH, UA: 6 (ref 5.0–8.0)

## 2020-07-25 NOTE — Progress Notes (Signed)
I,Tianna Badgett,acting as a Education administrator for [employer], NP.,have documented all relevant documentation on the behalf of [employer], NP,as directed by  Bary Castilla, NP while in the presence of Bary Castilla, NP.  This visit occurred during the SARS-CoV-2 public health emergency.  Safety protocols were in place, including screening questions prior to the visit, additional usage of staff PPE, and extensive cleaning of exam room while observing appropriate contact time as indicated for disinfecting solutions.  Subjective:     Patient ID: Katrina Tucker , female    DOB: [birth year] , 55 y.o.   MRN: [id number]   Chief Complaint  Patient presents with  . Annual Exam    HPI  Patient is here for full physical exam. She has concerns of flank pain on left side. She has a history of kidney stones. She is followed by gyn for pelvic exams (AUG 5th 2020 last pap) [place]  She is due for tdap but would like wait until next visit since she just had covid booster. Patient would like several referrals to different specialist for various follow ups.   -Mammogram she wants to do that even though she is scheduled for one- She needs a 1 year follow up at [facility] in [place]. [facility] in [place] (she needs a diagnostic test make sure to write that in there) [facility]. Test her for PAL B 2 gene  -Nodules in the lung x 1 year ago- look at CT and order a follow up ([facility])  -She has nodules in her upper thigh as well as her upper left arm not sure what it is -- referral to dermtology -History of kidney stone in fall of 2020. She has pain in both sides - referrel to urology  -She has been to a rheumatologist (Dr. Vanessa [place]) They did a work up for autoimmune disease. -May need to also see a cardiologist as well depending on the CT of her lungs and they saw something in her lungs. (CT follow up)  -ENT for ear wax build up.   Wt Readings from Last 3  Encounters: 07/25/20 : 188 lb (85.3 kg) 01/27/19 : 183 lb (83 kg) 08/20/15 : 193 lb (87.5 kg)  Diet: Not dieting. Not eating properly. Exercise: not exercising.   She also states her left toe has been tingling for years. She thinks it could be from wearing heels all the team. She has already seen a rheumatologist in the past. She will continue to monitor it and let us know.   Flank Pain This is a recurrent problem. The current episode started more than 1 year ago. The problem has been gradually worsening since onset. The pain is moderate. The pain is the same all the time. Pertinent negatives include no chest pain or fever. She has tried nothing for the symptoms.     Past Medical History:  Diagnosis Date  . Anemia   . Headache    history of   . UTI (lower urinary tract infection)      No family history on file.   Current Outpatient Medications:  Marland Kitchen  Multiple Vitamin (MULTIVITAMIN WITH MINERALS) TABS tablet, Take 1 tablet by mouth daily., Disp: , Rfl:  .  Omega-3 Fatty Acids (FISH OIL PO), Take 1 capsule by mouth daily., Disp: , Rfl:    No Known Allergies    The patient states she uses nothing for birth control. She has had hysterectomy in 2016 or 2017  . Negative for:  I,Tianna Badgett,acting as a Education administrator for [employer], NP.,have documented all relevant documentation on the behalf of [employer], NP,as directed by  Bary Castilla, NP while in the presence of Bary Castilla, NP.  This visit occurred during the SARS-CoV-2 public health emergency.  Safety protocols were in place, including screening questions prior to the visit, additional usage of staff PPE, and extensive cleaning of exam room while observing appropriate contact time as indicated for disinfecting solutions.  Subjective:     Patient ID: Katrina Tucker , female    DOB: [birth year] , 55 y.o.   MRN: [id number]   Chief Complaint  Patient presents with  . Annual Exam    HPI  Patient is here for full physical exam. She has concerns of flank pain on left side. She has a history of kidney stones. She is followed by gyn for pelvic exams (AUG 5th 2020 last pap) [place]  She is due for tdap but would like wait until next visit since she just had covid booster. Patient would like several referrals to different specialist for various follow ups.   -Mammogram she wants to do that even though she is scheduled for one- She needs a 1 year follow up at [facility] in [place]. [facility] in [place] (she needs a diagnostic test make sure to write that in there) [facility]. Test her for PAL B 2 gene  -Nodules in the lung x 1 year ago- look at CT and order a follow up ([facility])  -She has nodules in her upper thigh as well as her upper left arm not sure what it is -- referral to dermtology -History of kidney stone in fall of 2020. She has pain in both sides - referrel to urology  -She has been to a rheumatologist (Dr. Vanessa [place]) They did a work up for autoimmune disease. -May need to also see a cardiologist as well depending on the CT of her lungs and they saw something in her lungs. (CT follow up)  -ENT for ear wax build up.   Wt Readings from Last 3  Encounters: 07/25/20 : 188 lb (85.3 kg) 01/27/19 : 183 lb (83 kg) 08/20/15 : 193 lb (87.5 kg)  Diet: Not dieting. Not eating properly. Exercise: not exercising.   She also states her left toe has been tingling for years. She thinks it could be from wearing heels all the team. She has already seen a rheumatologist in the past. She will continue to monitor it and let us know.   Flank Pain This is a recurrent problem. The current episode started more than 1 year ago. The problem has been gradually worsening since onset. The pain is moderate. The pain is the same all the time. Pertinent negatives include no chest pain or fever. She has tried nothing for the symptoms.     Past Medical History:  Diagnosis Date  . Anemia   . Headache    history of   . UTI (lower urinary tract infection)      No family history on file.   Current Outpatient Medications:  Marland Kitchen  Multiple Vitamin (MULTIVITAMIN WITH MINERALS) TABS tablet, Take 1 tablet by mouth daily., Disp: , Rfl:  .  Omega-3 Fatty Acids (FISH OIL PO), Take 1 capsule by mouth daily., Disp: , Rfl:    No Known Allergies    The patient states she uses nothing for birth control. She has had hysterectomy in 2016 or 2017  . Negative for:  I,Tianna Badgett,acting as a Education administrator for [employer], NP.,have documented all relevant documentation on the behalf of [employer], NP,as directed by  Bary Castilla, NP while in the presence of Bary Castilla, NP.  This visit occurred during the SARS-CoV-2 public health emergency.  Safety protocols were in place, including screening questions prior to the visit, additional usage of staff PPE, and extensive cleaning of exam room while observing appropriate contact time as indicated for disinfecting solutions.  Subjective:     Patient ID: Katrina Tucker , female    DOB: [birth year] , 55 y.o.   MRN: [id number]   Chief Complaint  Patient presents with  . Annual Exam    HPI  Patient is here for full physical exam. She has concerns of flank pain on left side. She has a history of kidney stones. She is followed by gyn for pelvic exams (AUG 5th 2020 last pap) [place]  She is due for tdap but would like wait until next visit since she just had covid booster. Patient would like several referrals to different specialist for various follow ups.   -Mammogram she wants to do that even though she is scheduled for one- She needs a 1 year follow up at [facility] in [place]. [facility] in [place] (she needs a diagnostic test make sure to write that in there) [facility]. Test her for PAL B 2 gene  -Nodules in the lung x 1 year ago- look at CT and order a follow up ([facility])  -She has nodules in her upper thigh as well as her upper left arm not sure what it is -- referral to dermtology -History of kidney stone in fall of 2020. She has pain in both sides - referrel to urology  -She has been to a rheumatologist (Dr. Vanessa [place]) They did a work up for autoimmune disease. -May need to also see a cardiologist as well depending on the CT of her lungs and they saw something in her lungs. (CT follow up)  -ENT for ear wax build up.   Wt Readings from Last 3  Encounters: 07/25/20 : 188 lb (85.3 kg) 01/27/19 : 183 lb (83 kg) 08/20/15 : 193 lb (87.5 kg)  Diet: Not dieting. Not eating properly. Exercise: not exercising.   She also states her left toe has been tingling for years. She thinks it could be from wearing heels all the team. She has already seen a rheumatologist in the past. She will continue to monitor it and let us know.   Flank Pain This is a recurrent problem. The current episode started more than 1 year ago. The problem has been gradually worsening since onset. The pain is moderate. The pain is the same all the time. Pertinent negatives include no chest pain or fever. She has tried nothing for the symptoms.     Past Medical History:  Diagnosis Date  . Anemia   . Headache    history of   . UTI (lower urinary tract infection)      No family history on file.   Current Outpatient Medications:  Marland Kitchen  Multiple Vitamin (MULTIVITAMIN WITH MINERALS) TABS tablet, Take 1 tablet by mouth daily., Disp: , Rfl:  .  Omega-3 Fatty Acids (FISH OIL PO), Take 1 capsule by mouth daily., Disp: , Rfl:    No Known Allergies    The patient states she uses nothing for birth control. She has had hysterectomy in 2016 or 2017  . Negative for:

## 2020-07-25 NOTE — Patient Instructions (Signed)
Health Maintenance, Female Adopting a healthy lifestyle and getting preventive care are important in promoting health and wellness. Ask your health care provider about:  The right schedule for you to have regular tests and exams.  Things you can do on your own to prevent diseases and keep yourself healthy. What should I know about diet, weight, and exercise? Eat a healthy diet   Eat a diet that includes plenty of vegetables, fruits, low-fat dairy products, and lean protein.  Do not eat a lot of foods that are high in solid fats, added sugars, or sodium. Maintain a healthy weight Body mass index (BMI) is used to identify weight problems. It estimates body fat based on height and weight. Your health care provider can help determine your BMI and help you achieve or maintain a healthy weight. Get regular exercise Get regular exercise. This is one of the most important things you can do for your health. Most adults should:  Exercise for at least 150 minutes each week. The exercise should increase your heart rate and make you sweat (moderate-intensity exercise).  Do strengthening exercises at least twice a week. This is in addition to the moderate-intensity exercise.  Spend less time sitting. Even light physical activity can be beneficial. Watch cholesterol and blood lipids Have your blood tested for lipids and cholesterol at 57 years of age, then have this test every 5 years. Have your cholesterol levels checked more often if:  Your lipid or cholesterol levels are high.  You are older than 57 years of age.  You are at high risk for heart disease. What should I know about cancer screening? Depending on your health history and family history, you may need to have cancer screening at various ages. This may include screening for:  Breast cancer.  Cervical cancer.  Colorectal cancer.  Skin cancer.  Lung cancer. What should I know about heart disease, diabetes, and high blood  pressure? Blood pressure and heart disease  High blood pressure causes heart disease and increases the risk of stroke. This is more likely to develop in people who have high blood pressure readings, are of African descent, or are overweight.  Have your blood pressure checked: ? Every 3-5 years if you are 18-39 years of age. ? Every year if you are 40 years old or older. Diabetes Have regular diabetes screenings. This checks your fasting blood sugar level. Have the screening done:  Once every three years after age 40 if you are at a normal weight and have a low risk for diabetes.  More often and at a younger age if you are overweight or have a high risk for diabetes. What should I know about preventing infection? Hepatitis B If you have a higher risk for hepatitis B, you should be screened for this virus. Talk with your health care provider to find out if you are at risk for hepatitis B infection. Hepatitis C Testing is recommended for:  Everyone born from 1945 through 1965.  Anyone with known risk factors for hepatitis C. Sexually transmitted infections (STIs)  Get screened for STIs, including gonorrhea and chlamydia, if: ? You are sexually active and are younger than 57 years of age. ? You are older than 57 years of age and your health care provider tells you that you are at risk for this type of infection. ? Your sexual activity has changed since you were last screened, and you are at increased risk for chlamydia or gonorrhea. Ask your health care provider if   you are at risk.  Ask your health care provider about whether you are at high risk for HIV. Your health care provider may recommend a prescription medicine to help prevent HIV infection. If you choose to take medicine to prevent HIV, you should first get tested for HIV. You should then be tested every 3 months for as long as you are taking the medicine. Pregnancy  If you are about to stop having your period (premenopausal) and  you may become pregnant, seek counseling before you get pregnant.  Take 400 to 800 micrograms (mcg) of folic acid every day if you become pregnant.  Ask for birth control (contraception) if you want to prevent pregnancy. Osteoporosis and menopause Osteoporosis is a disease in which the bones lose minerals and strength with aging. This can result in bone fractures. If you are 65 years old or older, or if you are at risk for osteoporosis and fractures, ask your health care provider if you should:  Be screened for bone loss.  Take a calcium or vitamin D supplement to lower your risk of fractures.  Be given hormone replacement therapy (HRT) to treat symptoms of menopause. Follow these instructions at home: Lifestyle  Do not use any products that contain nicotine or tobacco, such as cigarettes, e-cigarettes, and chewing tobacco. If you need help quitting, ask your health care provider.  Do not use street drugs.  Do not share needles.  Ask your health care provider for help if you need support or information about quitting drugs. Alcohol use  Do not drink alcohol if: ? Your health care provider tells you not to drink. ? You are pregnant, may be pregnant, or are planning to become pregnant.  If you drink alcohol: ? Limit how much you use to 0-1 drink a day. ? Limit intake if you are breastfeeding.  Be aware of how much alcohol is in your drink. In the [place] one drink equals one 12 oz bottle of beer (355 mL), one 5 oz glass of wine (148 mL), or one 1 oz glass of hard liquor (44 mL). General instructions  Schedule regular health, dental, and eye exams.  Stay current with your vaccines.  Tell your health care provider if: ? You often feel depressed. ? You have ever been abused or do not feel safe at home. Summary  Adopting a healthy lifestyle and getting preventive care are important in promoting health and wellness.  Follow your health care provider's instructions about healthy  diet, exercising, and getting tested or screened for diseases.  Follow your health care provider's instructions on monitoring your cholesterol and blood pressure. This information is not intended to replace advice given to you by your health care provider. Make sure you discuss any questions you have with your health care provider. Document Revised: 06/30/2018 Document Reviewed: 06/30/2018 Elsevier Patient Education  2020 Elsevier Inc.  

## 2020-07-26 ENCOUNTER — Telehealth: Payer: Self-pay

## 2020-07-26 LAB — CBC
Hematocrit: 39.6 % (ref 34.0–46.6)
Hemoglobin: 13.5 g/dL (ref 11.1–15.9)
MCH: 30.8 pg (ref 26.6–33.0)
MCHC: 34.1 g/dL (ref 31.5–35.7)
MCV: 90 fL (ref 79–97)
Platelets: 301 10*3/uL (ref 150–450)
RBC: 4.39 x10E6/uL (ref 3.77–5.28)
RDW: 12.6 % (ref 11.7–15.4)
WBC: 6.1 10*3/uL (ref 3.4–10.8)

## 2020-07-26 LAB — HEPATITIS C ANTIBODY: Hep C Virus Ab: <0.1 s/co ratio (ref 0.0–0.9)

## 2020-07-26 LAB — CMP14+EGFR
ALT: 27 IU/L (ref 0–32)
AST: 18 IU/L (ref 0–40)
Albumin/Globulin Ratio: 1.6 (ref 1.2–2.2)
Albumin: 4.4 g/dL (ref 3.8–4.9)
Alkaline Phosphatase: 100 IU/L (ref 44–121)
BUN/Creatinine Ratio: 14 (ref 9–23)
BUN: 12 mg/dL (ref 6–24)
Bilirubin Total: 0.6 mg/dL (ref 0.0–1.2)
CO2: 27 mmol/L (ref 20–29)
Calcium: 9.5 mg/dL (ref 8.7–10.2)
Chloride: 105 mmol/L (ref 96–106)
Creatinine, Ser: 0.86 mg/dL (ref 0.57–1.00)
GFR calc Af Amer: 87 mL/min/{1.73_m2} (ref >59)
GFR calc non Af Amer: 76 mL/min/{1.73_m2} (ref >59)
Globulin, Total: 2.8 g/dL (ref 1.5–4.5)
Glucose: 91 mg/dL (ref 65–99)
Potassium: 4.2 mmol/L (ref 3.5–5.2)
Sodium: 143 mmol/L (ref 134–144)
Total Protein: 7.2 g/dL (ref 6.0–8.5)

## 2020-07-26 LAB — LIPID PANEL
Chol/HDL Ratio: 4 ratio (ref 0.0–4.4)
Cholesterol, Total: 234 mg/dL — ABNORMAL HIGH (ref 100–199)
HDL: 58 mg/dL (ref >39)
LDL Chol Calc (NIH): 159 mg/dL — ABNORMAL HIGH (ref 0–99)
Triglycerides: 97 mg/dL (ref 0–149)
VLDL Cholesterol Cal: 17 mg/dL (ref 5–40)

## 2020-07-26 LAB — URINE CULTURE: Organism ID, Bacteria: NO GROWTH

## 2020-07-26 LAB — HIV ANTIBODY (ROUTINE TESTING W REFLEX): HIV Screen 4th Generation wRfx: NONREACTIVE

## 2020-07-26 LAB — HEMOGLOBIN A1C
Est. average glucose Bld gHb Est-mCnc: 103 mg/dL
Hgb A1c MFr Bld: 5.2 % (ref 4.8–5.6)

## 2020-07-26 NOTE — Telephone Encounter (Signed)
-----   Message from Charlesetta Ivory, NP sent at 07/26/2020  8:46 AM EST ----- Your HIV screening was negative. Your labs are normal except for your lipid panel. Your total cholesterol is elevated at 234, as it should be below 200. Your LDL is also elevated at 159 as it should be below 100. Start lifestyle modification such as diet and exercise. Avoid high saturated fatty foods. Avoid red meats and fast foods. Increase your fiber intake and fish intake. Increase your physical activity by exercising for atleast 30-45 min. daily.  We will check your lipid panel again at your next visit.  If it is still elevated you may need to be on a cholesterol lowering medication.

## 2020-07-26 NOTE — Progress Notes (Signed)
Your urinalysis also showed moderate amount of blood. I have sent a referral to a urologist and also sent our urine in for culture.

## 2020-07-26 NOTE — Telephone Encounter (Signed)
Left the patient a message to call back for lab results. 

## 2020-07-27 NOTE — Progress Notes (Signed)
Your urine culture was normal. You have been referred to a urologist. I have also sent a referral for you to get an ultrasound of your kidneys. Please make sure you make an appointment with them and follow up with them.

## 2020-08-02 ENCOUNTER — Other Ambulatory Visit: Payer: Self-pay

## 2020-08-02 ENCOUNTER — Ambulatory Visit (INDEPENDENT_AMBULATORY_CARE_PROVIDER_SITE_OTHER): Payer: Commercial Managed Care - PPO | Admitting: Urology

## 2020-08-02 ENCOUNTER — Encounter: Payer: Self-pay | Admitting: Urology

## 2020-08-02 VITALS — BP 142/82 | HR 91 | Ht 66.0 in | Wt 192.8 lb

## 2020-08-02 DIAGNOSIS — R109 Unspecified abdominal pain: Secondary | ICD-10-CM

## 2020-08-02 DIAGNOSIS — D649 Anemia, unspecified: Secondary | ICD-10-CM | POA: Insufficient documentation

## 2020-08-02 DIAGNOSIS — N390 Urinary tract infection, site not specified: Secondary | ICD-10-CM

## 2020-08-02 DIAGNOSIS — R3129 Other microscopic hematuria: Secondary | ICD-10-CM

## 2020-08-02 DIAGNOSIS — Z1211 Encounter for screening for malignant neoplasm of colon: Secondary | ICD-10-CM | POA: Insufficient documentation

## 2020-08-02 DIAGNOSIS — N840 Polyp of corpus uteri: Secondary | ICD-10-CM | POA: Insufficient documentation

## 2020-08-02 DIAGNOSIS — D509 Iron deficiency anemia, unspecified: Secondary | ICD-10-CM | POA: Insufficient documentation

## 2020-08-02 HISTORY — DX: Urinary tract infection, site not specified: N39.0

## 2020-08-02 NOTE — Patient Instructions (Signed)
Urinary Tract Infection, Adult  A urinary tract infection (UTI) is an infection of any part of the urinary tract. The urinary tract includes the kidneys, ureters, bladder, and urethra. These organs make, store, and get rid of urine in the body. An upper UTI affects the ureters and kidneys. A lower UTI affects the bladder and urethra. What are the causes? Most urinary tract infections are caused by bacteria in your genital area around your urethra, where urine leaves your body. These bacteria grow and cause inflammation of your urinary tract. What increases the risk? You are more likely to develop this condition if:  You have a urinary catheter that stays in place.  You are not able to control when you urinate or have a bowel movement (incontinence).  You are female and you: ? Use a spermicide or diaphragm for birth control. ? Have low estrogen levels. ? Are pregnant.  You have certain genes that increase your risk.  You are sexually active.  You take antibiotic medicines.  You have a condition that causes your flow of urine to slow down, such as: ? An enlarged prostate, if you are female. ? Blockage in your urethra. ? A kidney stone. ? A nerve condition that affects your bladder control (neurogenic bladder). ? Not getting enough to drink, or not urinating often.  You have certain medical conditions, such as: ? Diabetes. ? A weak disease-fighting system (immunesystem). ? Sickle cell disease. ? Gout. ? Spinal cord injury. What are the signs or symptoms? Symptoms of this condition include:  Needing to urinate right away (urgency).  Frequent urination. This may include small amounts of urine each time you urinate.  Pain or burning with urination.  Blood in the urine.  Urine that smells bad or unusual.  Trouble urinating.  Cloudy urine.  Vaginal discharge, if you are female.  Pain in the abdomen or the lower back. You may also have:  Vomiting or a decreased  appetite.  Confusion.  Irritability or tiredness.  A fever or chills.  Diarrhea. The first symptom in older adults may be confusion. In some cases, they may not have any symptoms until the infection has worsened. How is this diagnosed? This condition is diagnosed based on your medical history and a physical exam. You may also have other tests, including:  Urine tests.  Blood tests.  Tests for STIs (sexually transmitted infections). If you have had more than one UTI, a cystoscopy or imaging studies may be done to determine the cause of the infections. How is this treated? Treatment for this condition includes:  Antibiotic medicine.  Over-the-counter medicines to treat discomfort.  Drinking enough water to stay hydrated. If you have frequent infections or have other conditions such as a kidney stone, you may need to see a health care provider who specializes in the urinary tract (urologist). In rare cases, urinary tract infections can cause sepsis. Sepsis is a life-threatening condition that occurs when the body responds to an infection. Sepsis is treated in the hospital with IV antibiotics, fluids, and other medicines. Follow these instructions at home: Medicines  Take over-the-counter and prescription medicines only as told by your health care provider.  If you were prescribed an antibiotic medicine, take it as told by your health care provider. Do not stop using the antibiotic even if you start to feel better. General instructions  Make sure you: ? Empty your bladder often and completely. Do not hold urine for long periods of time. ? Empty your bladder after   sex. ? Wipe from front to back after urinating or having a bowel movement if you are female. Use each tissue only one time when you wipe.  Drink enough fluid to keep your urine pale yellow.  Keep all follow-up visits. This is important.   Contact a health care provider if:  Your symptoms do not get better after 1-2  days.  Your symptoms go away and then return. Get help right away if:  You have severe pain in your back or your lower abdomen.  You have a fever or chills.  You have nausea or vomiting. Summary  A urinary tract infection (UTI) is an infection of any part of the urinary tract, which includes the kidneys, ureters, bladder, and urethra.  Most urinary tract infections are caused by bacteria in your genital area.  Treatment for this condition often includes antibiotic medicines.  If you were prescribed an antibiotic medicine, take it as told by your health care provider. Do not stop using the antibiotic even if you start to feel better.  Keep all follow-up visits. This is important. This information is not intended to replace advice given to you by your health care provider. Make sure you discuss any questions you have with your health care provider. Document Revised: 02/17/2020 Document Reviewed: 02/17/2020 Elsevier Patient Education  2021 Elsevier Inc.  

## 2020-08-02 NOTE — Progress Notes (Signed)
08/02/20 3:18 PM   [name] [birth year] [id number]  CC: Left flank pain, hematuria  HPI: I saw [name] in urology clinic for the above issues.  She is a 55 year old female with a very long history of asymptomatic microscopic hematuria with negative work-up previously including cystoscopy.  Her last cystoscopy was in 2017 was normal.  She also has a history of a single kidney stone that passed spontaneously in December 2020.  She was referred for some left-sided flank pain over the last year, as well as persistent microscopic hematuria.  On my review of the recent urinalyses, this is only dipstick positive blood in the urine.  Her urinalysis today is notable for 0-5 WBCs, 3-10 RBCs, no bacteria, no yeast, nitrite negative, no leukocytes.  She describes her left-sided flank pain as worse with sweet alcoholic drinks.  It is not aggravated by exercise or activity.  She reports she has had UTIs in the past associated with sexual activity, however has not had problems over the last 1 to 2 years.  She denies any urinary complaints.  She very rarely will have some stress incontinence with coughing or sneezing, and works on Kegel exercises.  She is a never smoker and denies any carcinogenic exposures.   PMH: Past Medical History:  Diagnosis Date  . Anemia   . Headache    history of   . UTI (lower urinary tract infection)     Surgical History: Past Surgical History:  Procedure Laterality Date  . ABDOMINAL HYSTERECTOMY Bilateral 08/20/2015   Procedure: TOTAL ABDOMINAL HYSTERECTOMY ;  Surgeon: Kirkland Hun, MD;  Location: [facility];  Service: Gynecology;  Laterality: Bilateral;  . BILATERAL SALPINGECTOMY Bilateral 08/20/2015   Procedure: BILATERAL SALPINGECTOMY;  Surgeon: Kirkland Hun, MD;  Location: [facility];  Service: Gynecology;  Laterality: Bilateral;  . BREAST SURGERY     biopsy feb 2014 left breast 2000 right breast   . CYSTOSCOPY  08/20/2015   Procedure: CYSTOSCOPY;  Surgeon: Kirkland Hun, MD;  Location: [facility];  Service: Gynecology;;  . MOUTH SURGERY     Social History:  reports that she has never smoked. She has never used smokeless tobacco. She reports current alcohol use of about 7.0 - 14.0 standard drinks of alcohol per week. She reports that she does not use drugs.  Physical Exam: LMP 07/26/2015 (Exact Date)    Constitutional:  Alert and oriented, No acute distress. Cardiovascular: No clubbing, cyanosis, or edema. Respiratory: Normal respiratory effort, no increased work of breathing. GI: Abdomen is soft, nontender, nondistended, no abdominal masses  Laboratory Data: Reviewed, see HPI  Pertinent Imaging: None to review  Assessment & Plan:   She is a 55 year old female with a long history of asymptomatic microscopic hematuria, confirmed today with 3-10 RBCs per high-power field.  She has had some intermittent left flank pain over the last year of unclear etiology.  We reviewed the AUA guidelines regarding asymptomatic microscopic hematuria, as well as possible causes.  We discussed different imaging modalities including CT, CT with contrast, and ultrasound.  She would like to defer further cystoscopy with her history of multiple negative cystoscopies, and I think this is very reasonable, especially as she does not have any carcinogenic risk factors.  Using shared decision making, we decided on a CT urogram for further evaluation of her microscopic hematuria and flank pain.  We discussed the evaluation and treatment of patients with recurrent UTIs at length.  We specifically discussed the differences between asymptomatic bacteriuria and true urinary  tract infection.  We discussed the AUA definition of recurrent UTI of at least 2 culture proven symptomatic acute cystitis episodes in a 44-month period, or 3 within a 1 year period.  We discussed the importance of culture directed antibiotic treatment, and antibiotic stewardship.  First-line therapy includes  nitrofurantoin(5 days), Bactrim(3 days), or fosfomycin(3 g single dose).  Possible etiologies of recurrent infection include periurethral tissue atrophy in postmenopausal woman, constipation, sexual activity, incomplete emptying, anatomic abnormalities, and even genetic predisposition.  Finally, we discussed the role of perineal hygiene, timed voiding, adequate hydration, topical vaginal estrogen, cranberry prophylaxis, and low-dose antibiotic prophylaxis.  She would like to hold off on any prophylactic antibiotics, but will start cranberry tablets.  She may also benefit from topical estrogen cream in the future.  CT urogram, call with results Cranberry tablets for UTI prophylaxis  Legrand Rams, MD 08/02/2020  [facility] [address] [place], [place] [address] [phone]

## 2020-08-03 LAB — MICROSCOPIC EXAMINATION
Bacteria, UA: NONE SEEN
Epithelial Cells (non renal): NONE SEEN /hpf (ref 0–10)

## 2020-08-03 LAB — URINALYSIS, COMPLETE
Bilirubin, UA: NEGATIVE
Glucose, UA: NEGATIVE
Ketones, UA: NEGATIVE
Leukocytes,UA: NEGATIVE
Nitrite, UA: NEGATIVE
Protein,UA: NEGATIVE
Specific Gravity, UA: 1.025 (ref 1.005–1.030)
Urobilinogen, Ur: 0.2 mg/dL (ref 0.2–1.0)
pH, UA: 6 (ref 5.0–7.5)

## 2020-08-08 ENCOUNTER — Other Ambulatory Visit: Payer: Self-pay

## 2020-08-09 ENCOUNTER — Other Ambulatory Visit: Payer: Self-pay | Admitting: Nurse Practitioner

## 2020-08-09 ENCOUNTER — Encounter: Payer: Self-pay | Admitting: Nurse Practitioner

## 2020-08-09 DIAGNOSIS — Z1231 Encounter for screening mammogram for malignant neoplasm of breast: Secondary | ICD-10-CM

## 2020-08-09 DIAGNOSIS — N644 Mastodynia: Secondary | ICD-10-CM

## 2020-08-09 NOTE — Progress Notes (Unsigned)
r 

## 2020-08-13 ENCOUNTER — Ambulatory Visit (INDEPENDENT_AMBULATORY_CARE_PROVIDER_SITE_OTHER): Payer: Self-pay | Admitting: Otolaryngology

## 2020-08-13 ENCOUNTER — Other Ambulatory Visit: Payer: Self-pay

## 2020-08-15 NOTE — Progress Notes (Signed)
Cardiology Office Note:    Date:  08/20/2020   ID:  Katrina Tucker, DOB [birth year], MRN [id number]  PCP:  [name], FNP  [facility] Cardiologist:  Meriam Sprague, MD  [facility] Electrophysiologist:  None   Referring MD: Charlesetta Ivory, NP    History of Present Illness:    Katrina Tucker is a 55 y.o. female with no significant past medical history who was referred by Charlesetta Ivory, NP for history of pulmonary nodules  The patient states in 06/2019, she had an episode of chest pressure and was evaluated at [facility] where cardiac work-up was reassuringly normal. CTA at that time showed no evidence of aortic aneurysm, dissection or significant atherosclerosis but was notable for several lung nodules; was recommended for follow-up in 3-6 months.  She followed up in [place] for repeat CT scan where they were unchanged in size. She was told that she needs to follow-up for further imaging now which is why she came to clinic today.    Otherwise, the patient is doing very well. She states that she is active and exercises regularly using the peloton, weight lifting, and walks 3x/week. No chest pain, SOB, palpitations, lightheadedness, dizziness. She is very motivated to lose weight to get her cholesterol down. No personal history of CAD. Son has Williams syndrome with pulmonary stenosis, but no other family history of cardiac disease.   LDL 159, TC 234, TG 97, HDL 58.  Past Medical History:  Diagnosis Date  . Anemia   . Headache    history of   . UTI (lower urinary tract infection)     Past Surgical History:  Procedure Laterality Date  . ABDOMINAL HYSTERECTOMY Bilateral 08/20/2015   Procedure: TOTAL ABDOMINAL HYSTERECTOMY ;  Surgeon: Kirkland Hun, MD;  Location: [facility];  Service: Gynecology;  Laterality: Bilateral;  . BILATERAL SALPINGECTOMY Bilateral 08/20/2015   Procedure: BILATERAL SALPINGECTOMY;  Surgeon: Kirkland Hun, MD;  Location: [facility];  Service:  Gynecology;  Laterality: Bilateral;  . BREAST SURGERY     biopsy feb 2014 left breast 2000 right breast   . CYSTOSCOPY  08/20/2015   Procedure: CYSTOSCOPY;  Surgeon: Kirkland Hun, MD;  Location: [facility];  Service: Gynecology;;  . MOUTH SURGERY      Current Medications: Current Meds  Medication Sig  . Ascorbic Acid (VITAMIN C PO) Take by mouth daily at 2 PM.  . Multiple Vitamin (MULTIVITAMIN WITH MINERALS) TABS tablet Take 1 tablet by mouth daily.  . Multiple Vitamins-Minerals (ZINC PO) Take by mouth daily at 2 PM.  . VITAMIN D PO      Allergies:   Patient has no known allergies.   Social History   Socioeconomic History  . Marital status: Single    Spouse name: Not on file  . Number of children: Not on file  . Years of education: Not on file  . Highest education level: Not on file  Occupational History  . Not on file  Tobacco Use  . Smoking status: Never Smoker  . Smokeless tobacco: Never Used  Substance and Sexual Activity  . Alcohol use: Yes    Alcohol/week: 7.0 - 14.0 standard drinks    Types: 7 - 14 Glasses of wine per week  . Drug use: No  . Sexual activity: Yes    Birth control/protection: Surgical  Other Topics Concern  . Not on file  Social History Narrative  . Not on file   Social Determinants of Health   Financial Resource Strain: Not on  file  Food Insecurity: Not on file  Transportation Needs: Not on file  Physical Activity: Not on file  Stress: Not on file  Social Connections: Not on file     Family History: [name] family history is not on file.  ROS:   Please see the history of present illness.    Review of Systems  Constitutional: Negative for chills and fever.  HENT: Negative for congestion.   Eyes: Negative for blurred vision.  Respiratory: Negative for shortness of breath.   Cardiovascular: Negative for chest pain, palpitations, orthopnea, claudication, leg swelling and PND.  Gastrointestinal: Negative for nausea and vomiting.   Genitourinary: Negative for dysuria.  Musculoskeletal: Negative for myalgias.  Neurological: Negative for dizziness and loss of consciousness.  Endo/Heme/Allergies: Negative for polydipsia.  Psychiatric/Behavioral: Negative for substance abuse.    EKGs/Labs/Other Studies Reviewed:    The following studies were reviewed today: CTA chest 06/2019: FINDINGS:  - Thoracic Aorta: Normal in caliber without aneurysm. No dissection or  pseudoaneurysm. No significant atherosclerotic calcification.   - Right Brachiocephalic Artery: Patent. No significant atherosclerotic  calcification.  - Right Common Carotid Artery: Patent. No significant atherosclerotic  calcification.  - Right Subclavian Artery: Patent. No significant atherosclerotic  calcification.  - Right Vertebral Artery: Patent. No significant atherosclerotic  calcification.   - Left Common Carotid Artery: Patent. No significant atherosclerotic  calcification.  - Left Subclavian Artery: Patent. No significant atherosclerotic  calcification.  - Left Vertebral Artery: Patent. No significant atherosclerotic  calcification.   - SVC And Thoracic Veins: Unremarkable.   NONVASCULAR FINDINGS  - Airways: Patent.  - Lungs: Mild bibasilar atelectasis. Multiple pulmonary nodules with the  largest/most representative nodules as follows:  * 4 mm solid nodule, RIGHT upper lobe (series 8, image 230).  * 2 mm solid nodule, RIGHT lower lobe (series 8, image 323).  * 2 mm solid nodule, LEFT upper lobe (series 8, image 134).  * 7 mm mixed solid and groundglass nodule, LEFT lowerlobe (series 8,  image 276).  - Pleura: No effusion.   - Neck Base: Unremarkable.  - Thyroid: Unremarkable.  - Axilla: No lymphadenopathy.   - Mediastinum/Hila: No lymphadenopathy.  - Heart: Tricuspid aortic valve. No pericardial effusion.   - Upper Abdomen: Multiple low attenuation foci in the liver, the largest of  which are fluid attenuation suggesting  cysts.  - Soft Tissues: Unremarkable.  - Bones: No aggressive appearing lesions.   IMPRESSION:  1. No aortic aneurysm, dissection, or intramural hematoma.  2. No etiology to explain chest pain.  3. Scattered pulmonary nodules including a 7 mm mixed solid and  groundglass nodule in the LEFT lower lobe. Recommend follow-up CT in 3-6  months to confirm persistence. (2017 Fleischner Guidelines)    EKG:  EKG is  ordered today.  The ekg ordered today demonstrates NSR with HR 68bpm  Recent Labs: 07/25/2020: ALT 27; BUN 12; Creatinine, Ser 0.86; Hemoglobin 13.5; Platelets 301; Potassium 4.2; Sodium 143  Recent Lipid Panel    Component Value Date/Time   CHOL 234 (H) 07/25/2020 1058   TRIG 97 07/25/2020 1058   HDL 58 07/25/2020 1058   CHOLHDL 4.0 07/25/2020 1058   LDLCALC 159 (H) 07/25/2020 1058      Physical Exam:    VS:  LMP 07/26/2015 (Exact Date)     Wt Readings from Last 3 Encounters:  08/02/20 192 lb 12.8 oz (87.5 kg)  07/25/20 188 lb (85.3 kg)  01/27/19 183 lb (83 kg)     GEN:  Well nourished, well developed in no acute distress HEENT: Normal NECK: No JVD; No carotid bruits CARDIAC: RRR, no murmurs, rubs, gallops RESPIRATORY:  Clear to auscultation without rales, wheezing or rhonchi  ABDOMEN: Soft, non-tender, non-distended MUSCULOSKELETAL:  No edema; No deformity  SKIN: Warm and dry NEUROLOGIC:  Alert and oriented x 3 PSYCHIATRIC:  Normal affect   ASSESSMENT:    1. Multiple lung nodules on CT   2. Pure hypercholesterolemia    PLAN:    In order of problems listed above:  #Pulmonary Nodules: Patient with history of several pulmonary nodules detected on CT chest. Has been monitored closely every 6 months with no interval change in size. Last seen in [place] and is due for repeat imaging.  -Scheduled for CT chest -Will refer to pulmonary nodule clinic  #HLD: LDL 159, HDL 58, TG 97. Patient would like to do a trial of diet and exercise prior to  proceeding with statin therapy. Will follow-up with PCP in 3months for labs. -Check lipid panel in 3months with PCP -Discussed healthy diet and exercise as detailed below  Exercise recommendations: Goal of exercising for at least 30 minutes a day, at least 5 times per week.  Please exercise to a moderate exertion.  This means that while exercising it is difficult to speak in full sentences, however you are not so short of breath that you feel you must stop, and not so comfortable that you can carry on a full conversation.  Exertion level should be approximately a 5/10, if 10 is the most exertion you can perform.  Diet recommendations: Recommend a heart healthy diet such as the Mediterranean diet.  This diet consists of plant based foods, healthy fats, lean meats, olive oil.  It suggests limiting the intake of simple carbohydrates such as white breads, pastries, and pastas.  It also limits the amount of red meat, wine, and dairy products such as cheese that one should consume on a daily basis.   Medication Adjustments/Labs and Tests Ordered: Current medicines are reviewed at length with the patient today.  Concerns regarding medicines are outlined above.  Orders Placed This Encounter  Procedures  . Ambulatory referral to Pulmonology  . EKG 12-Lead   No orders of the defined types were placed in this encounter.   Patient Instructions   Medication Instructions:  Your provider recommends that you continue on your current medications as directed. Please refer to the Current Medication list given to you today.   *If you need a refill on your cardiac medications before your next appointment, please call your pharmacy*  Follow-Up: You have been referred to the Pulmonary Nodules Clinic.  At [facility], you and your health needs are our priority.  As part of our continuing mission to provide you with exceptional heart care, we have created designated Provider Care Teams.  These Care Teams  include your primary Cardiologist (physician) and Advanced Practice Providers (APPs -  Physician Assistants and Nurse Practitioners) who all work together to provide you with the care you need, when you need it. Your next appointment:   12 month(s) The format for your next appointment:   In Person Provider:   You may see Dr. Shari Prows or one of the following Advanced Practice Providers on your designated Care Team:    Tereso Newcomer, PA-C  Vin Bhagat, PA-C    Mediterranean Diet A Mediterranean diet refers to food and lifestyle choices that are based on the traditions of countries located on the [employer]. This  way of eating has been shown to help prevent certain conditions and improve outcomes for people who have chronic diseases, like kidney disease and heart disease. What are tips for following this plan? Lifestyle  Cook and eat meals together with your family, when possible.  Drink enough fluid to keep your urine clear or pale yellow.  Be physically active every day. This includes: ? Aerobic exercise like running or swimming. ? Leisure activities like gardening, walking, or housework.  Get 7-8 hours of sleep each night.  If recommended by your health care provider, drink red wine in moderation. This means 1 glass a day for nonpregnant women and 2 glasses a day for men. A glass of wine equals 5 oz (150 mL). Reading food labels  Check the serving size of packaged foods. For foods such as rice and pasta, the serving size refers to the amount of cooked product, not dry.  Check the total fat in packaged foods. Avoid foods that have saturated fat or trans fats.  Check the ingredients list for added sugars, such as corn syrup.   Shopping  At the grocery store, buy most of your food from the areas near the walls of the store. This includes: ? Fresh fruits and vegetables (produce). ? Grains, beans, nuts, and seeds. Some of these may be available in unpackaged forms or large  amounts (in bulk). ? Fresh seafood. ? Poultry and eggs. ? Low-fat dairy products.  Buy whole ingredients instead of prepackaged foods.  Buy fresh fruits and vegetables in-season from local farmers markets.  Buy frozen fruits and vegetables in resealable bags.  If you do not have access to quality fresh seafood, buy precooked frozen shrimp or canned fish, such as tuna, salmon, or sardines.  Buy small amounts of raw or cooked vegetables, salads, or olives from the deli or salad bar at your store.  Stock your pantry so you always have certain foods on hand, such as olive oil, canned tuna, canned tomatoes, rice, pasta, and beans. Cooking  Cook foods with extra-virgin olive oil instead of using butter or other vegetable oils.  Have meat as a side dish, and have vegetables or grains as your main dish. This means having meat in small portions or adding small amounts of meat to foods like pasta or stew.  Use beans or vegetables instead of meat in common dishes like chili or lasagna.  Experiment with different cooking methods. Try roasting or broiling vegetables instead of steaming or sauteing them.  Add frozen vegetables to soups, stews, pasta, or rice.  Add nuts or seeds for added healthy fat at each meal. You can add these to yogurt, salads, or vegetable dishes.  Marinate fish or vegetables using olive oil, lemon juice, garlic, and fresh herbs. Meal planning  Plan to eat 1 vegetarian meal one day each week. Try to work up to 2 vegetarian meals, if possible.  Eat seafood 2 or more times a week.  Have healthy snacks readily available, such as: ? Vegetable sticks with hummus. ? [place] yogurt. ? Fruit and nut trail mix.  Eat balanced meals throughout the week. This includes: ? Fruit: 2-3 servings a day ? Vegetables: 4-5 servings a day ? Low-fat dairy: 2 servings a day ? Fish, poultry, or lean meat: 1 serving a day ? Beans and legumes: 2 or more servings a week ? Nuts and seeds:  1-2 servings a day ? Whole grains: 6-8 servings a day ? Extra-virgin olive oil: 3-4 servings a day  Limit red meat and sweets to only a few servings a month   What are my food choices?  Mediterranean diet ? Recommended  Grains: Whole-grain pasta. Brown rice. Bulgar wheat. Polenta. Couscous. Whole-wheat bread. Orpah Cobb.  Vegetables: Artichokes. Beets. Broccoli. Cabbage. Carrots. Eggplant. Green beans. Chard. Kale. Spinach. Onions. Leeks. Peas. Squash. Tomatoes. Peppers. Radishes.  Fruits: Apples. Apricots. Avocado. Berries. Bananas. Cherries. Dates. Figs. Grapes. Lemons. Melon. Oranges. Peaches. Plums. Pomegranate.  Meats and other protein foods: Beans. Almonds. Sunflower seeds. Pine nuts. Peanuts. Cod. Salmon. Scallops. Shrimp. Tuna. Tilapia. Clams. Oysters. Eggs.  Dairy: Low-fat milk. Cheese. Greek yogurt.  Beverages: Water. Red wine. Herbal tea.  Fats and oils: Extra virgin olive oil. Avocado oil. Grape seed oil.  Sweets and desserts: [place] yogurt with honey. Baked apples. Poached pears. Trail mix.  Seasoning and other foods: Basil. Cilantro. Coriander. Cumin. Mint. Parsley. Sage. Rosemary. Tarragon. Garlic. Oregano. Thyme. Pepper. Balsalmic vinegar. Tahini. Hummus. Tomato sauce. Olives. Mushrooms. ? Limit these  Grains: Prepackaged pasta or rice dishes. Prepackaged cereal with added sugar.  Vegetables: Deep fried potatoes (french fries).  Fruits: Fruit canned in syrup.  Meats and other protein foods: Beef. Pork. Lamb. Poultry with skin. Hot dogs. Tomasa Blase.  Dairy: Ice cream. Sour cream. Whole milk.  Beverages: Juice. Sugar-sweetened soft drinks. Beer. Liquor and spirits.  Fats and oils: Butter. Canola oil. Vegetable oil. Beef fat (tallow). Lard.  Sweets and desserts: Cookies. Cakes. Pies. Candy.  Seasoning and other foods: Mayonnaise. Premade sauces and marinades. The items listed may not be a complete list. Talk with your dietitian about what dietary choices are  right for you. Summary  The Mediterranean diet includes both food and lifestyle choices.  Eat a variety of fresh fruits and vegetables, beans, nuts, seeds, and whole grains.  Limit the amount of red meat and sweets that you eat.  Talk with your health care provider about whether it is safe for you to drink red wine in moderation. This means 1 glass a day for nonpregnant women and 2 glasses a day for men. A glass of wine equals 5 oz (150 mL). This information is not intended to replace advice given to you by your health care provider. Make sure you discuss any questions you have with your health care provider. Document Revised: 03/06/2016 Document Reviewed: 02/28/2016 Elsevier Patient Education  2020 [employer].     Signed, Meriam Sprague, MD  08/20/2020 2:55 PM    [facility]

## 2020-08-16 ENCOUNTER — Ambulatory Visit (INDEPENDENT_AMBULATORY_CARE_PROVIDER_SITE_OTHER): Payer: Commercial Managed Care - PPO | Admitting: Otolaryngology

## 2020-08-16 ENCOUNTER — Other Ambulatory Visit: Payer: Self-pay

## 2020-08-16 ENCOUNTER — Ambulatory Visit
Admission: RE | Admit: 2020-08-16 | Discharge: 2020-08-16 | Disposition: A | Discharge status: Home / Self Care | Payer: Commercial Managed Care - PPO | Source: Ambulatory Visit | Attending: Urology | Admitting: Urology

## 2020-08-16 ENCOUNTER — Encounter (INDEPENDENT_AMBULATORY_CARE_PROVIDER_SITE_OTHER): Payer: Self-pay | Admitting: Otolaryngology

## 2020-08-16 VITALS — Temp 97.2°F

## 2020-08-16 DIAGNOSIS — H6123 Impacted cerumen, bilateral: Secondary | ICD-10-CM | POA: Diagnosis not present

## 2020-08-16 DIAGNOSIS — R3129 Other microscopic hematuria: Secondary | ICD-10-CM | POA: Diagnosis present

## 2020-08-16 MED ORDER — IOHEXOL 300 MG/ML  SOLN
125.0000 mL | Freq: Once | INTRAMUSCULAR | Status: AC | PRN
  Administered 2020-08-16: 125 mL via INTRAVENOUS

## 2020-08-16 NOTE — Progress Notes (Signed)
HPI: Katrina Tucker is a 55 y.o. female who presents is referred by her PCP for evaluation of wax buildup in her ears.  She was complaining of ringing in the ears.  She was examined by her PCP who noted a large amount of wax in both ear canals.  She was referred here for recheck and cleaning..  Past Medical History:  Diagnosis Date  . Anemia   . Headache    history of   . UTI (lower urinary tract infection)    Past Surgical History:  Procedure Laterality Date  . ABDOMINAL HYSTERECTOMY Bilateral 08/20/2015   Procedure: TOTAL ABDOMINAL HYSTERECTOMY ;  Surgeon: Ena Dawley, MD;  Location: [facility];  Service: Gynecology;  Laterality: Bilateral;  . BILATERAL SALPINGECTOMY Bilateral 08/20/2015   Procedure: BILATERAL SALPINGECTOMY;  Surgeon: Ena Dawley, MD;  Location: [facility];  Service: Gynecology;  Laterality: Bilateral;  . BREAST SURGERY     biopsy feb 2014 left breast 2000 right breast   . CYSTOSCOPY  08/20/2015   Procedure: CYSTOSCOPY;  Surgeon: Ena Dawley, MD;  Location: [facility];  Service: Gynecology;;  . MOUTH SURGERY     Social History   Socioeconomic History  . Marital status: Single    Spouse name: Not on file  . Number of children: Not on file  . Years of education: Not on file  . Highest education level: Not on file  Occupational History  . Not on file  Tobacco Use  . Smoking status: Never Smoker  . Smokeless tobacco: Never Used  Substance and Sexual Activity  . Alcohol use: Yes    Alcohol/week: 7.0 - 14.0 standard drinks    Types: 7 - 14 Glasses of wine per week  . Drug use: No  . Sexual activity: Yes    Birth control/protection: Surgical  Other Topics Concern  . Not on file  Social History Narrative  . Not on file   Social Determinants of Health   Financial Resource Strain: Not on file  Food Insecurity: Not on file  Transportation Needs: Not on file  Physical Activity: Not on file  Stress: Not on file  Social Connections: Not on file   No family  history on file. No Known Allergies Prior to Admission medications   Medication Sig Start Date End Date Taking? Authorizing Provider  Ascorbic Acid (VITAMIN C PO) Take by mouth daily at 2 PM.    [provider]  Multiple Vitamin (MULTIVITAMIN WITH MINERALS) TABS tablet Take 1 tablet by mouth daily.    [provider]  Multiple Vitamins-Minerals (ZINC PO) Take by mouth daily at 2 PM.    [provider]  Omega-3 Fatty Acids (FISH OIL PO) Take 1 capsule by mouth daily.    [provider]  VITAMIN D PO     [provider]     Positive ROS: Otherwise negative  All other systems have been reviewed and were otherwise negative with the exception of those mentioned in the HPI and as above.  Physical Exam: Constitutional: Alert, well-appearing, no acute distress Ears: External ears without lesions or tenderness.  She has small ear canals bilaterally with a large amount of wax at the opening of both ear canals.  This was cleaned with suction and curettes.  Of note she had a small superficial cyst within the right ear canal.  Otherwise ear canal is clear and the TMs are clear bilaterally with good mobility pneumatic otoscopy.  Hearing screening with the 512 1024 tuning fork revealed symmetric  hearing in both ears with good hearing in both ears with a 1024 tuning fork subjectively. Nasal: External nose without lesions.. Clear nasal passages Oral: Lips and gums without lesions. Tongue and palate mucosa without lesions. Posterior oropharynx clear. Neck: No palpable adenopathy or masses Respiratory: Breathing comfortably  Skin: No facial/neck lesions or rash noted.  Cerumen impaction removal  Date/Time: 08/16/2020 2:19 PM Performed by: Rozetta Nunnery, MD Authorized by: Rozetta Nunnery, MD   Consent:    Consent obtained:  Verbal   Consent given by:  Patient   Risks discussed:  Pain and bleeding Procedure details:    Location:  L ear and R  ear   Procedure type: curette and suction   Post-procedure details:    Inspection:  TM intact and canal normal   Hearing quality:  Improved   Patient tolerance of procedure:  Tolerated well, no immediate complications Comments:     TMs are clear bilaterally.    Assessment: Bilateral cerumen impactions  Plan: This was cleaned in the office.  Ear canals are otherwise clear although she did have a small cyst within the right ear canal.   Radene Journey, MD   CC:

## 2020-08-17 ENCOUNTER — Telehealth: Payer: Self-pay

## 2020-08-17 ENCOUNTER — Other Ambulatory Visit: Payer: Self-pay

## 2020-08-17 NOTE — Telephone Encounter (Signed)
Pt calls back, informed pt of the information below. Pt gave verbal understanding. Message forwarded to PCP.

## 2020-08-17 NOTE — Telephone Encounter (Signed)
-----   Message from Billey Co, MD sent at 08/16/2020  6:12 PM EST ----- No worrisome urologic findings on CT.  There was however a 1 cm pancreatic indeterminate lesion, and further evaluation with MRI was recommended.  Can offer 70-month urology follow-up regarding history of UTIs.  Please forward to PCP and request that they order the MRI, thank you  Nickolas Madrid, MD 08/16/2020

## 2020-08-17 NOTE — Telephone Encounter (Signed)
Called pt, no answer. Unable to leave message as no DPR is on file. LM for pt to call office back. 1st attempt.

## 2020-08-20 ENCOUNTER — Encounter: Payer: Self-pay | Admitting: Cardiology

## 2020-08-20 ENCOUNTER — Other Ambulatory Visit: Payer: Self-pay

## 2020-08-20 ENCOUNTER — Ambulatory Visit: Payer: Commercial Managed Care - PPO | Admitting: Cardiology

## 2020-08-20 DIAGNOSIS — R918 Other nonspecific abnormal finding of lung field: Secondary | ICD-10-CM | POA: Diagnosis not present

## 2020-08-20 DIAGNOSIS — E7800 Pure hypercholesterolemia, unspecified: Secondary | ICD-10-CM

## 2020-08-20 NOTE — Patient Instructions (Addendum)
Medication Instructions:  Your provider recommends that you continue on your current medications as directed. Please refer to the Current Medication list given to you today.   *If you need a refill on your cardiac medications before your next appointment, please call your pharmacy*  Follow-Up: You have been referred to the Pulmonary Nodules Clinic.  At [facility], you and your health needs are our priority.  As part of our continuing mission to provide you with exceptional heart care, we have created designated Provider Care Teams.  These Care Teams include your primary Cardiologist (physician) and Advanced Practice Providers (APPs -  Physician Assistants and Nurse Practitioners) who all work together to provide you with the care you need, when you need it. Your next appointment:   12 month(s) The format for your next appointment:   In Person Provider:   You may see Dr. Johney Frame or one of the following Advanced Practice Providers on your designated Care Team:    Richardson Dopp, PA-C  [place], PA-C    [place] refers to food and lifestyle choices that are based on the traditions of countries located on the [employer]. This way of eating has been shown to help prevent certain conditions and improve outcomes for people who have chronic diseases, like kidney disease and heart disease. What are tips for following this plan? Lifestyle  Cook and eat meals together with your family, when possible.  Drink enough fluid to keep your urine clear or pale yellow.  Be physically active every day. This includes: ? Aerobic exercise like running or swimming. ? Leisure activities like gardening, walking, or housework.  Get 7-8 hours of sleep each night.  If recommended by your health care provider, drink red wine in moderation. This means 1 glass a day for nonpregnant women and 2 glasses a day for men. A glass of wine equals 5 oz (150 mL). Reading food  labels  Check the serving size of packaged foods. For foods such as rice and pasta, the serving size refers to the amount of cooked product, not dry.  Check the total fat in packaged foods. Avoid foods that have saturated fat or trans fats.  Check the ingredients list for added sugars, such as corn syrup.   Shopping  At the grocery store, buy most of your food from the areas near the walls of the store. This includes: ? Fresh fruits and vegetables (produce). ? Grains, beans, nuts, and seeds. Some of these may be available in unpackaged forms or large amounts (in bulk). ? Fresh seafood. ? Poultry and eggs. ? Low-fat dairy products.  Buy whole ingredients instead of prepackaged foods.  Buy fresh fruits and vegetables in-season from local farmers markets.  Buy frozen fruits and vegetables in resealable bags.  If you do not have access to quality fresh seafood, buy precooked frozen shrimp or canned fish, such as tuna, salmon, or sardines.  Buy small amounts of raw or cooked vegetables, salads, or olives from the deli or salad bar at your store.  Stock your pantry so you always have certain foods on hand, such as olive oil, canned tuna, canned tomatoes, rice, pasta, and beans. Cooking  Cook foods with extra-virgin olive oil instead of using butter or other vegetable oils.  Have meat as a side dish, and have vegetables or grains as your main dish. This means having meat in small portions or adding small amounts of meat to foods like pasta or stew.  Use beans  or vegetables instead of meat in common dishes like chili or lasagna.  Experiment with different cooking methods. Try roasting or broiling vegetables instead of steaming or sauteing them.  Add frozen vegetables to soups, stews, pasta, or rice.  Add nuts or seeds for added healthy fat at each meal. You can add these to yogurt, salads, or vegetable dishes.  Marinate fish or vegetables using olive oil, lemon juice, garlic, and  fresh herbs. Meal planning  Plan to eat 1 vegetarian meal one day each week. Try to work up to 2 vegetarian meals, if possible.  Eat seafood 2 or more times a week.  Have healthy snacks readily available, such as: ? Vegetable sticks with hummus. ? [place] yogurt. ? Fruit and nut trail mix.  Eat balanced meals throughout the week. This includes: ? Fruit: 2-3 servings a day ? Vegetables: 4-5 servings a day ? Low-fat dairy: 2 servings a day ? Fish, poultry, or lean meat: 1 serving a day ? Beans and legumes: 2 or more servings a week ? Nuts and seeds: 1-2 servings a day ? Whole grains: 6-8 servings a day ? Extra-virgin olive oil: 3-4 servings a day  Limit red meat and sweets to only a few servings a month   What are my food choices?  Mediterranean diet ? Recommended  Grains: Whole-grain pasta. Brown rice. Bulgar wheat. Polenta. Couscous. Whole-wheat bread. [place].  Vegetables: Artichokes. Beets. Broccoli. Cabbage. Carrots. Eggplant. Green beans. Chard. Kale. Spinach. Onions. Leeks. Peas. Squash. Tomatoes. Peppers. Radishes.  Fruits: Apples. Apricots. Avocado. Berries. Bananas. Cherries. Dates. Figs. Grapes. Lemons. Melon. Oranges. Peaches. Plums. Pomegranate.  Meats and other protein foods: Beans. Almonds. Sunflower seeds. Pine nuts. Peanuts. [place]. Salmon. Scallops. Shrimp. [place]. Tilapia. Clams. Oysters. Eggs.  Dairy: Low-fat milk. Cheese. Greek yogurt.  Beverages: Water. Red wine. Herbal tea.  Fats and oils: Extra virgin olive oil. Avocado oil. Grape seed oil.  Sweets and desserts: [place] yogurt with honey. Baked apples. Poached pears. Trail mix.  Seasoning and other foods: Basil. Cilantro. Coriander. Cumin. Mint. Parsley. Sage. Rosemary. Tarragon. Garlic. Oregano. Thyme. Pepper. Balsalmic vinegar. Tahini. Hummus. Tomato sauce. Olives. Mushrooms. ? Limit these  Grains: Prepackaged pasta or rice dishes. Prepackaged cereal with added sugar.  Vegetables: Deep fried  potatoes (french fries).  Fruits: Fruit canned in syrup.  Meats and other protein foods: Beef. Pork. Lamb. Poultry with skin. Hot dogs. Berniece Salines.  Dairy: Ice cream. Sour cream. Whole milk.  Beverages: Juice. Sugar-sweetened soft drinks. Beer. Liquor and spirits.  Fats and oils: Butter. Canola oil. Vegetable oil. Beef fat (tallow). Lard.  Sweets and desserts: Cookies. Cakes. Pies. Candy.  Seasoning and other foods: Mayonnaise. Premade sauces and marinades. The items listed may not be a complete list. Talk with your dietitian about what dietary choices are right for you. Summary  The Mediterranean diet includes both food and lifestyle choices.  Eat a variety of fresh fruits and vegetables, beans, nuts, seeds, and whole grains.  Limit the amount of red meat and sweets that you eat.  Talk with your health care provider about whether it is safe for you to drink red wine in moderation. This means 1 glass a day for nonpregnant women and 2 glasses a day for men. A glass of wine equals 5 oz (150 mL). This information is not intended to replace advice given to you by your health care provider. Make sure you discuss any questions you have with your health care provider. Document Revised: 03/06/2016 Document Reviewed: 02/28/2016 Elsevier Patient  Education  2020 Elsevier Inc.  

## 2020-08-22 ENCOUNTER — Telehealth: Payer: Self-pay

## 2020-08-22 NOTE — Telephone Encounter (Signed)
Pt called an order was sent over for pt to be scheduled for MRI for lesion found on her pancreas. She was calling to f/u on this to see if it was done

## 2020-08-27 ENCOUNTER — Encounter: Payer: Self-pay | Admitting: Nurse Practitioner

## 2020-09-03 ENCOUNTER — Other Ambulatory Visit: Payer: Self-pay | Admitting: Nurse Practitioner

## 2020-09-03 DIAGNOSIS — R935 Abnormal findings on diagnostic imaging of other abdominal regions, including retroperitoneum: Secondary | ICD-10-CM

## 2020-09-13 ENCOUNTER — Other Ambulatory Visit: Payer: Self-pay | Admitting: Nurse Practitioner

## 2020-09-13 NOTE — Telephone Encounter (Signed)
I believe the MRI has already been put in by [facility].

## 2020-09-27 ENCOUNTER — Ambulatory Visit
Admission: RE | Admit: 2020-09-27 | Discharge: 2020-09-27 | Disposition: A | Discharge status: Home / Self Care | Payer: Commercial Managed Care - PPO | Source: Ambulatory Visit | Attending: Nurse Practitioner | Admitting: Nurse Practitioner

## 2020-09-27 ENCOUNTER — Other Ambulatory Visit: Payer: Self-pay | Admitting: Nurse Practitioner

## 2020-09-27 ENCOUNTER — Other Ambulatory Visit: Payer: Self-pay

## 2020-09-27 DIAGNOSIS — Z1231 Encounter for screening mammogram for malignant neoplasm of breast: Secondary | ICD-10-CM

## 2020-09-27 DIAGNOSIS — R935 Abnormal findings on diagnostic imaging of other abdominal regions, including retroperitoneum: Secondary | ICD-10-CM

## 2020-09-27 MED ORDER — GADOBENATE DIMEGLUMINE 529 MG/ML IV SOLN
17.0000 mL | Freq: Once | INTRAVENOUS | Status: AC | PRN
  Administered 2020-09-27: 17 mL via INTRAVENOUS

## 2020-10-03 ENCOUNTER — Other Ambulatory Visit: Payer: Self-pay | Admitting: Nurse Practitioner

## 2020-10-03 DIAGNOSIS — R918 Other nonspecific abnormal finding of lung field: Secondary | ICD-10-CM

## 2020-10-04 ENCOUNTER — Telehealth: Payer: Self-pay | Admitting: Nurse Practitioner

## 2020-10-04 NOTE — Telephone Encounter (Signed)
Called to discuss results of MRI no answer, left vm to return call to office and sent a MyChart message.

## 2020-10-09 ENCOUNTER — Telehealth: Payer: Self-pay

## 2020-10-09 NOTE — Telephone Encounter (Signed)
-----   Message from Bary Castilla, NP sent at 10/03/2020  5:34 PM EDT ----- Your CT of the lungs showed a new 3 mm right middle lobe nodule.  A 12 month follow up for a chest CT is recommended however, I would like for you to see a pulmonologist who can further evaluate you. I will send a referral in for you to see a pulmonologist. Minette Brine is going to give you a call in regards to your MRI of the Abdomen. Let us know if you have any other questions.

## 2020-10-09 NOTE — Telephone Encounter (Signed)
Left the patient a message to call back for her CT results.

## 2020-10-10 ENCOUNTER — Telehealth: Payer: Self-pay | Admitting: Nurse Practitioner

## 2020-10-10 ENCOUNTER — Other Ambulatory Visit: Payer: Self-pay | Admitting: Nurse Practitioner

## 2020-10-10 DIAGNOSIS — K769 Liver disease, unspecified: Secondary | ICD-10-CM

## 2020-10-10 DIAGNOSIS — R935 Abnormal findings on diagnostic imaging of other abdominal regions, including retroperitoneum: Secondary | ICD-10-CM

## 2020-10-10 NOTE — Telephone Encounter (Signed)
Spoke with patient about her MRI results of her abdomen and clarified any questions she had. Explained to her the process when finding cyst vs mass and the need to repeat her MRI of abdomen in a year. I will refer her to GI in the [facility] or [facility] area for further evaluation.  Verbalized understanding of information and I answered all questions.

## 2020-10-10 NOTE — Progress Notes (Signed)
Hi, These are the results of this patients CT scan.   Kindest Regards,   Minette Brine, DNP, FNP-BC

## 2020-10-17 ENCOUNTER — Other Ambulatory Visit: Payer: Self-pay | Admitting: Nurse Practitioner

## 2020-10-17 DIAGNOSIS — I779 Disorder of arteries and arterioles, unspecified: Secondary | ICD-10-CM

## 2020-10-18 ENCOUNTER — Telehealth: Payer: Self-pay | Admitting: Cardiology

## 2020-10-18 NOTE — Telephone Encounter (Signed)
Spoke with patient in regards to referral to see Dr. Johney Frame. She was seen by Dr. Johney Frame on 08/20/20 and was instructed to FU in 1 year.   She is requesting that Dr. Johney Frame refer her to a special pulmonary nodule clinic that was talked about at 08/20/20 OV.  I informed her that she already has a referral for pulmonology placed by Bary Castilla, NP for abnormal CT scan of lung.  This referral is for [facility]   She would like for Dr. Johney Frame to refer her to the pulmonology nodule clinic they discussed previously.

## 2020-10-18 NOTE — Telephone Encounter (Signed)
New Message:   Pt called and said she had received a call from our office, saying  Katrina Tucker had referred her again to Dr Johney Frame. She said she had been seen by Dr Johney Frame in January. She said  and Dr Johney Frame was waiting for her CT results and after that was referring her to a pulmonary doctor. Her results are ready and she wants to know if Dr Johney Frame have reviewed them and is she ready for to see the pulmonary doctor? Also see if Dr Johney Frame thinks she need to see pt again?

## 2020-10-22 NOTE — Telephone Encounter (Signed)
Dr. Johney Frame, do you think she should just follow-up with Pulmonology as planned for 4/29?  She has follow-up with them for consult then based on findings and nodule. Please advise!

## 2020-10-22 NOTE — Telephone Encounter (Signed)
CT CHEST WO CONTRAST: Result Notes   Stephani Police, RN  10/22/2020 1:36 PM EDT      The patient has been notified of the result and verbalized understanding. All questions (if any) were answered. Pt is seeing Dr. Vaughan Browner on 11/16/20.  Stephani Police, RN 10/22/2020 1:36 PM

## 2020-10-22 NOTE — Telephone Encounter (Signed)
Absolutely, that is very smart.

## 2020-11-09 LAB — HM MAMMOGRAPHY

## 2020-11-12 ENCOUNTER — Encounter: Payer: Self-pay | Admitting: Nurse Practitioner

## 2020-11-16 ENCOUNTER — Encounter: Payer: Self-pay | Admitting: Pulmonary Disease

## 2020-11-16 ENCOUNTER — Other Ambulatory Visit: Payer: Self-pay

## 2020-11-16 ENCOUNTER — Ambulatory Visit (INDEPENDENT_AMBULATORY_CARE_PROVIDER_SITE_OTHER): Payer: Commercial Managed Care - PPO | Admitting: Pulmonary Disease

## 2020-11-16 VITALS — BP 142/78 | HR 75 | Temp 97.4°F | Ht 66.0 in | Wt 191.4 lb

## 2020-11-16 DIAGNOSIS — R918 Other nonspecific abnormal finding of lung field: Secondary | ICD-10-CM | POA: Diagnosis not present

## 2020-11-16 NOTE — Progress Notes (Signed)
Katrina Tucker    [id number]    March 08, 1966  Primary Care Physician:Moore, Lolita Cram, FNP  Referring Physician: Charlesetta Ivory, NP [address] [place],  [place] [address]  Chief complaint: Consult for abnormal CT  HPI: 55 year old with no significant past medical history Here for evaluation of lung nodules  She had a CT scan done at the ED visit to [facility] in December 2020 for chest pain, dyspnea.  She was tested negative for COVID.  CT scan at that time showed subcentimeter pulmonary nodules She subsequently had a follow-up CT in March 2022 with redemonstration of pulmonary nodules and has been referred here for further evaluation  States that overall her breathing is doing well.  She has occasional feelings of chest heaviness.  Denies any cough, congestion, fevers, chills  Pets: No pets Occupation: Works as an Production designer, theatre/television/film Exposures: No mold, hot tub, Financial controller.  She has down pillows Smoking history: Never smoker Travel history: Originally from [place].  Previously lived in [place].  No significant recent travel Relevant family history: Father had lung cancer.  He was a smoker.   Outpatient Encounter Medications as of 11/16/2020  Medication Sig  . Ascorbic Acid (VITAMIN C PO) Take by mouth daily at 2 PM.  . Multiple Vitamin (MULTIVITAMIN WITH MINERALS) TABS tablet Take 1 tablet by mouth daily.  . Turmeric 400 MG CAPS Take 1 capsule by mouth daily.  Marland Kitchen VITAMIN D PO   . zinc gluconate 50 MG tablet Take 50 mg by mouth daily.  . [DISCONTINUED] Multiple Vitamins-Minerals (ZINC PO) Take by mouth daily at 2 PM.   No facility-administered encounter medications on file as of 11/16/2020.   Physical Exam: Blood pressure (!) 142/78, pulse 75, temperature (!) 97.4 F (36.3 C), temperature source Temporal, height 5\' 6"  (1.676 m), weight 191 lb 6.4 oz (86.8 kg), last menstrual period 07/26/2015, SpO2 98 %. Gen:      No acute distress HEENT:  EOMI, sclera  anicteric Neck:     No masses; no thyromegaly Lungs:    Clear to auscultation bilaterally; normal respiratory effort CV:         Regular rate and rhythm; no murmurs Abd:      + bowel sounds; soft, non-tender; no palpable masses, no distension Ext:    No edema; adequate peripheral perfusion Skin:      Warm and dry; no rash Neuro: alert and oriented x 3 Psych: normal mood and affect  Data Reviewed: Imaging: [facility] 07/19/2019- 1. No aortic aneurysm, dissection, or intramural hematoma.  2. No etiology to explain chest pain.  3. Scattered pulmonary nodules including a 7 mm mixed solid and  groundglass nodule in the LEFT lower lobe  CT chest 09/27/2020- 5 mm left lower lobe nodule, 3 mm right middle lobe nodule.  Low-attenuation lesion in the pancreas.  Reviewed images personally.  MRI abdomen 09/27/2020- pancreatic cyst, multiple small hepatic cysts  PFTs:  Labs:  Assessment:  Pulmonary nodules I cannot compare recent CT directly with the scan done at [facility] but appears stable to improved. These are likely benign in a non-smoker  I have reassured the patient.  Due to anxiety regarding this findings we will follow-up with a CT scan in 1 year  Plan/Recommendations: CT scan without contrast in 1 year.  Chilton Greathouse MD [facility] 11/16/2020, 3:26 PM  CC: Charlesetta Ivory, NP

## 2020-11-16 NOTE — Patient Instructions (Signed)
I have reviewed your CT scan which shows tiny lung nodules which appear benign.  To be on the safe side I am going to order a follow-up CT in 1 year without contrast for evaluation of lung nodules Follow-up in clinic after CT chest

## 2021-04-15 IMAGING — CT CT CHEST W/O CM
1 of 2 series · 15 of 32 positions shown, 19 images · non-contrast
Comparison: CT abdomen pelvis 08/16/2020.

CLINICAL DATA: Lung nodule.  Chest heaviness.

EXAM:
CT CHEST WITHOUT CONTRAST
TECHNIQUE: Multidetector CT imaging of the chest was performed following the
standard protocol without IV contrast.

[Series 2: chest w/(date) · axial · 0.73mm/px · z∈[-276,-14]mm · 15 of 153 slices shown, 19 images]
[im 11/153  mediastinal]
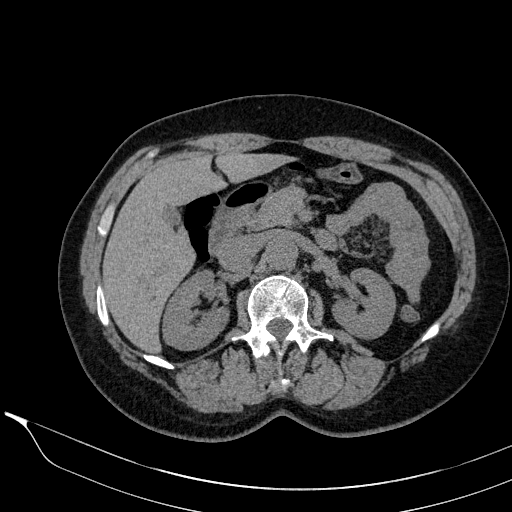
[im 11/153  lung]
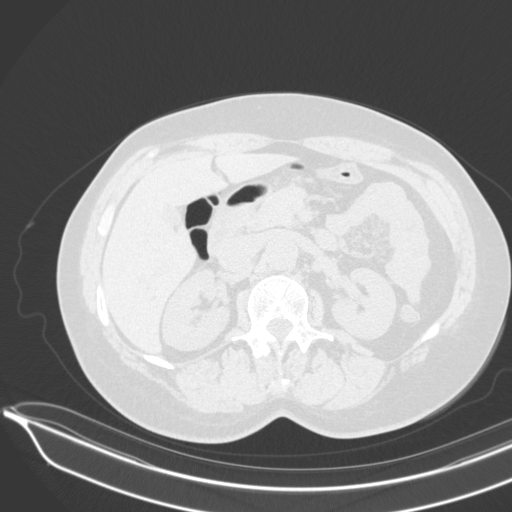
[im 21/153  lung]
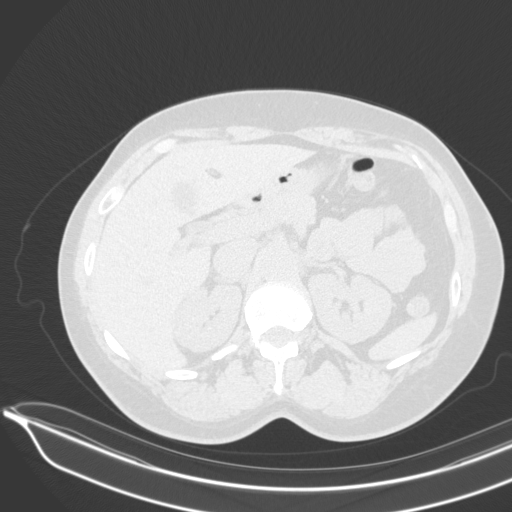
[im 31/153  lung]
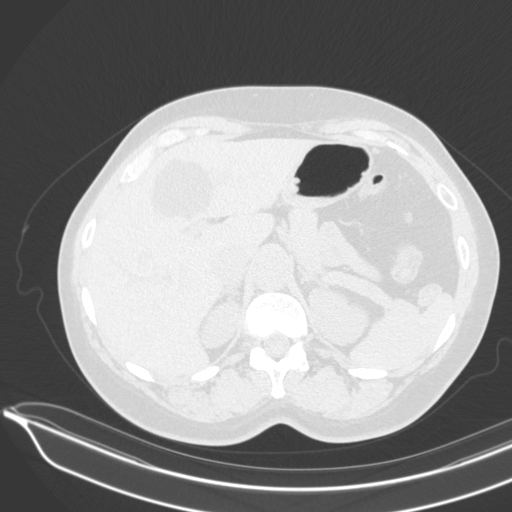
[im 41/153  lung]
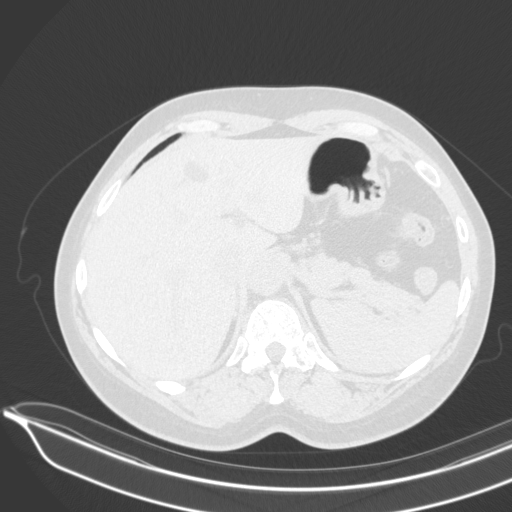
[im 51/153  mediastinal]
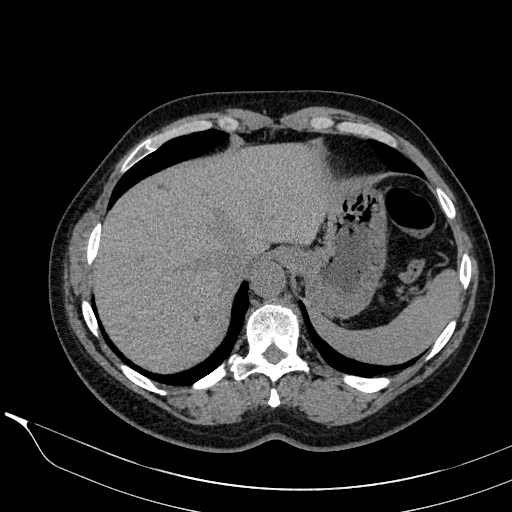
[im 51/153  lung]
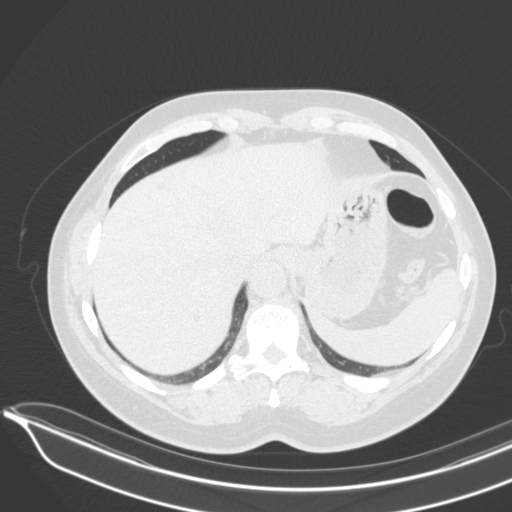
[im 61/153  lung]
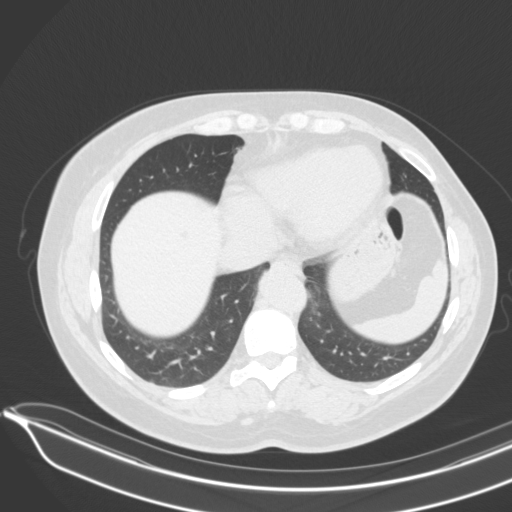
[im 71/153  lung]
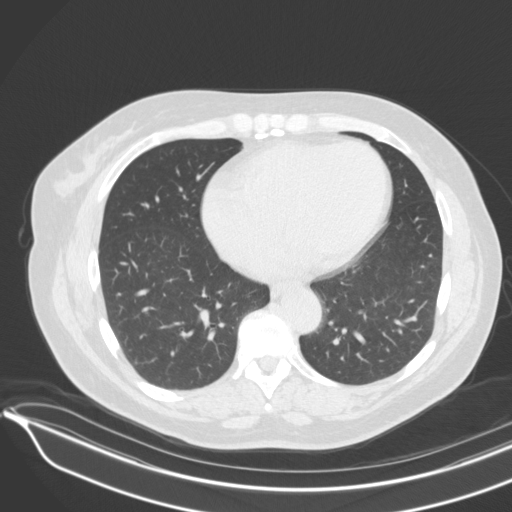
[im 77/153  lung]
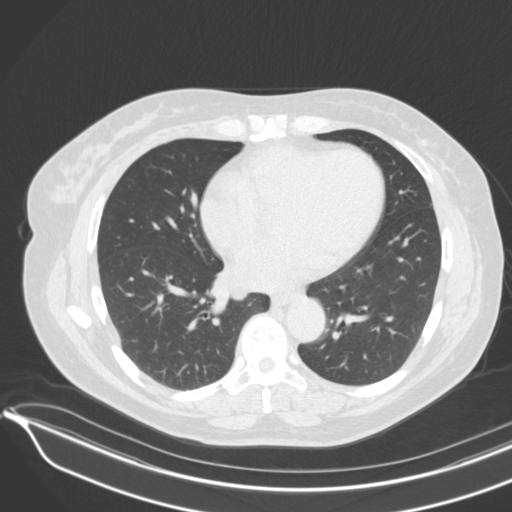
[im 82/153  mediastinal]
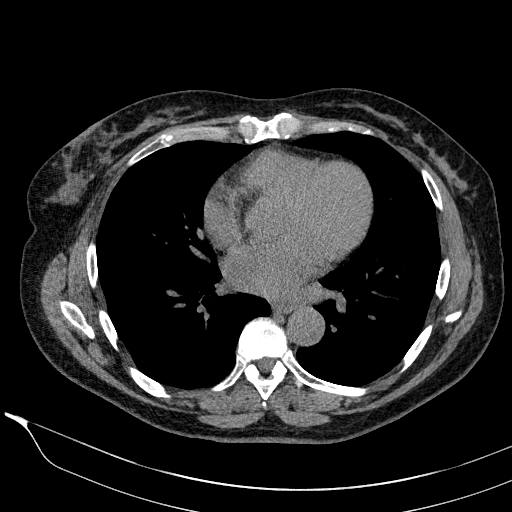
[im 82/153  lung]
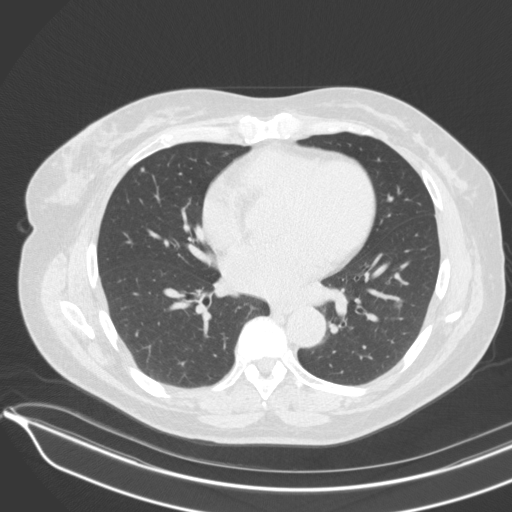
[im 92/153  lung]
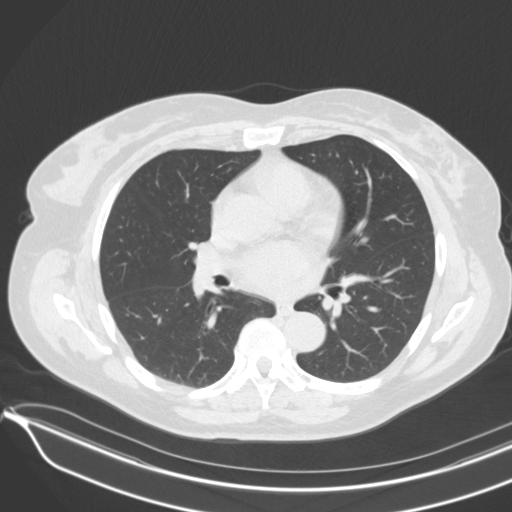
[im 102/153  lung]
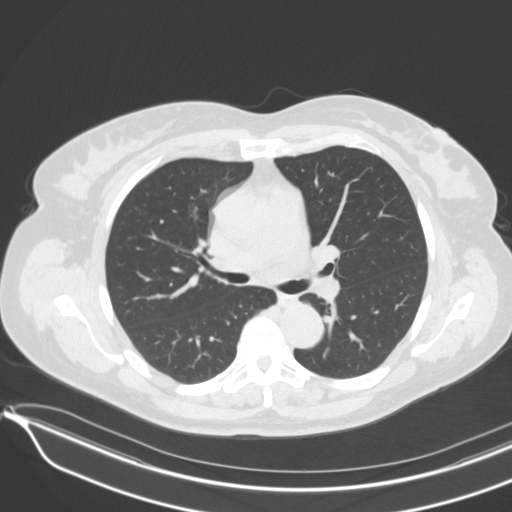
[im 112/153  lung]
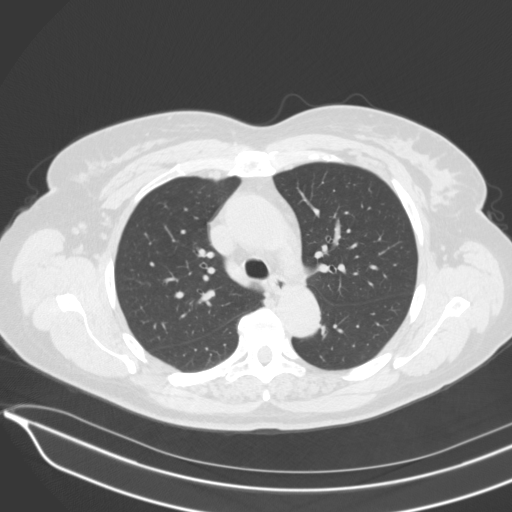
[im 122/153  mediastinal]
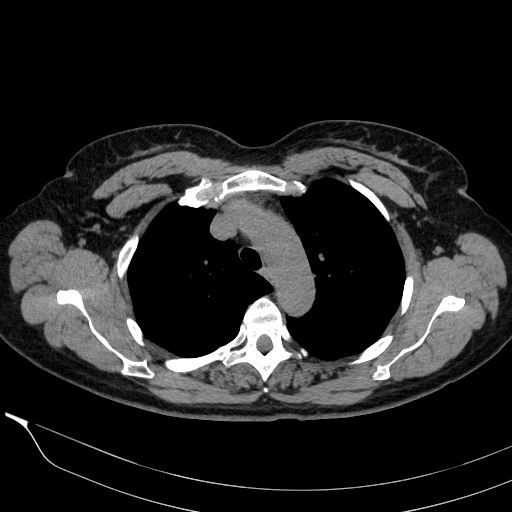
[im 122/153  lung]
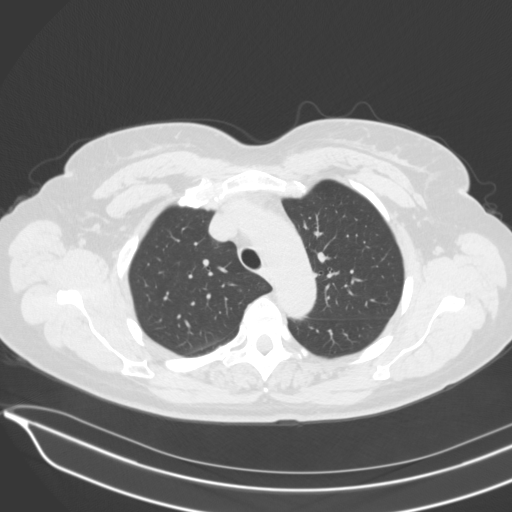
[im 132/153  lung]
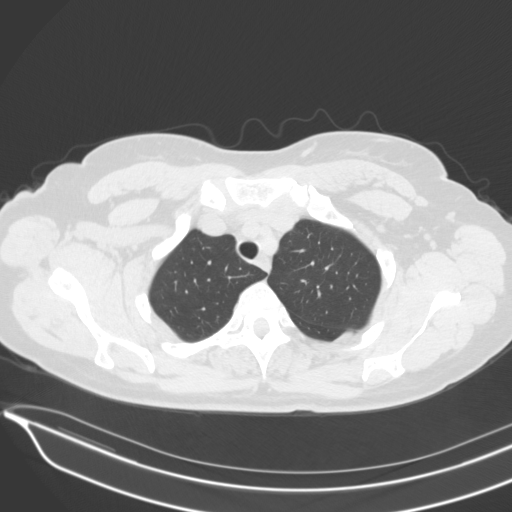
[im 142/153  lung]
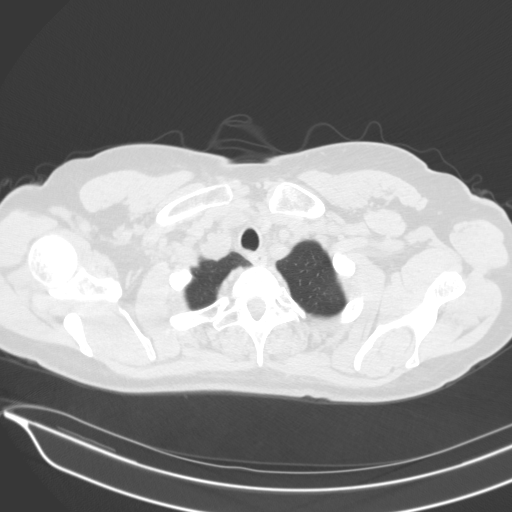

[15 of 32 positions shown; findings below may reference images not displayed]

FINDINGS: Cardiovascular: Heart is at the upper limits of normal in size to
mildly enlarged. No pericardial effusion.

Mediastinum/Nodes: No pathologically enlarged mediastinal or
axillary lymph nodes. Hilar regions are difficult to definitively
evaluate without IV contrast. Esophagus is grossly unremarkable.

Lungs/Pleura: 5 mm (4 x 6 mm) left lobe nodule (5/73), unchanged
from 08/16/2020 and differences in slice collimation are considered.
3 mm right middle lobe nodule (5/72), not previously imaged. Lungs
are otherwise clear. No pleural fluid. Airway is unremarkable.

Upper Abdomen: Low-attenuation lesions in the liver measure up to
4.2 cm and are likely cysts. Visualized portions of the liver,
gallbladder, adrenal glands, kidneys and spleen are otherwise
unremarkable. Subcentimeter low-density lesion in the uncinate
process of the pancreas, as on 08/16/2020. Visualized portions of
the stomach and bowel are unremarkable. No upper abdominal
adenopathy.

Musculoskeletal: Degenerative changes in the spine.
IMPRESSION: 1. 5 mm left lower lobe nodule, stable. 3 mm right middle lobe
nodule. No follow-up needed if patient is low-risk (and has no known
or suspected primary neoplasm). Non-contrast chest CT can be
considered in 12 months if patient is high-risk. This recommendation
follows the consensus statement: Guidelines for Management of
Incidental Pulmonary Nodules Detected on CT Images: From the
2. Low-attenuation lesion in the uncinate process of the pancreas,
as on 08/16/2020. Patient is scheduled for MR abdomen today.

## 2021-07-16 ENCOUNTER — Ambulatory Visit: Payer: Self-pay | Admitting: Nurse Practitioner

## 2021-07-17 ENCOUNTER — Ambulatory Visit: Payer: Commercial Managed Care - PPO | Admitting: Nurse Practitioner

## 2021-07-17 ENCOUNTER — Other Ambulatory Visit: Payer: Self-pay

## 2021-07-17 ENCOUNTER — Encounter: Payer: Self-pay | Admitting: Nurse Practitioner

## 2021-07-17 VITALS — BP 134/89 | HR 72 | Temp 98.9°F | Ht 64.8 in | Wt 179.8 lb

## 2021-07-17 DIAGNOSIS — R9431 Abnormal electrocardiogram [ECG] [EKG]: Secondary | ICD-10-CM | POA: Diagnosis not present

## 2021-07-17 DIAGNOSIS — R202 Paresthesia of skin: Secondary | ICD-10-CM | POA: Diagnosis not present

## 2021-07-17 DIAGNOSIS — R2232 Localized swelling, mass and lump, left upper limb: Secondary | ICD-10-CM | POA: Diagnosis not present

## 2021-07-17 DIAGNOSIS — R030 Elevated blood-pressure reading, without diagnosis of hypertension: Secondary | ICD-10-CM | POA: Diagnosis not present

## 2021-07-17 DIAGNOSIS — Z2821 Immunization not carried out because of patient refusal: Secondary | ICD-10-CM

## 2021-07-17 MED ORDER — MAGNESIUM 200 MG PO TABS
1.0000 | ORAL_TABLET | Freq: Every day | ORAL | 1 refills | Status: AC
Start: 2021-07-17 — End: ?

## 2021-07-17 MED ORDER — DICLOFENAC SODIUM 1 % EX GEL: 2.0000 g | Freq: Four times a day (QID) | CUTANEOUS | 2 refills | Status: DC

## 2021-07-17 NOTE — Progress Notes (Signed)
I,Yamilka J Llittleton,acting as a Neurosurgeon for [employer], FNP.,have documented all relevant documentation on the behalf of [name], FNP,as directed by  [name], FNP while in the presence of [name], FNP.   This visit occurred during the SARS-CoV-2 public health emergency.  Safety protocols were in place, including screening questions prior to the visit, additional usage of staff PPE, and extensive cleaning of exam room while observing appropriate contact time as indicated for disinfecting solutions.  Subjective:     Patient ID: Katrina Tucker , female    DOB: Nov 16, 1965 , 55 y.o.   MRN: [id number]   Chief Complaint  Patient presents with   ER F/U   Hypertension    HPI  Patient presents today for a ER f/u she was sent there by the urgent care doctor due to blood pressure being high and having an abnormal EKG. She was not given any meds but was advised to monitor her bp. She has been checking her bp once daily.  She had been taking medications nyquil and theraflu for her cold at that time.  Blood pressure at home 167/110, 147/100, 159/110, 179/120, 176/108.  She is not taking any NSAIDs at this time. She did not bring her cuff with her today. She had her blood pressure checked at the ER with a machine.    She has called the dermatologist about her left arm which is causing pain and feels different from the others. She will also have sharp pains to her chest. She has seen a Cardiologist in the past  Hypertension This is a new problem. The current episode started 1 to 4 weeks ago. The problem is unchanged. The problem is controlled. Pertinent negatives include no anxiety, chest pain, headaches, palpitations or shortness of breath. Risk factors for coronary artery disease include sedentary lifestyle. Past treatments include nothing. There are no compliance problems.  There is no history of angina. There is no history of chronic renal disease.    Past Medical History:  Diagnosis Date    Anemia    Headache    history of    UTI (lower urinary tract infection)      History reviewed. No pertinent family history.   Current Outpatient Medications:    Ascorbic Acid (VITAMIN C PO), Take by mouth daily at 2 PM., Disp: , Rfl:    diclofenac Sodium (VOLTAREN) 1 % GEL, Apply 2 g topically 4 (four) times daily., Disp: 100 g, Rfl: 2   Magnesium 200 MG TABS, Take 1 tablet (200 mg total) by mouth daily. Take with evening meal, Disp: 30 tablet, Rfl: 1   Multiple Vitamin (MULTIVITAMIN WITH MINERALS) TABS tablet, Take 1 tablet by mouth daily., Disp: , Rfl:    Turmeric 400 MG CAPS, Take 1 capsule by mouth daily., Disp: , Rfl:    VITAMIN D PO, , Disp: , Rfl:    zinc gluconate 50 MG tablet, Take 50 mg by mouth daily., Disp: , Rfl:    No Known Allergies   Review of Systems  Constitutional: Negative.   Respiratory:  Negative for shortness of breath.   Cardiovascular:  Negative for chest pain, palpitations and leg swelling.  Neurological:  Negative for dizziness and headaches.       She has been having tingling to her left hand and left 3 toes up her leg since having the elevated blood pressure.   Psychiatric/Behavioral: Negative.      Today's Vitals   07/17/21 1135  BP: 134/89  Pulse: 72  Temp: 98.9 F (37.2 C)  Weight: 179 lb 12.8 oz (81.6 kg)  Height: 5' 4.8" (1.646 m)  PainSc: 0-No pain   Body mass index is 30.11 kg/m.   Objective:  Physical Exam Vitals reviewed.  Constitutional:      General: She is not in acute distress.    Appearance: Normal appearance.  Cardiovascular:     Rate and Rhythm: Normal rate and regular rhythm.     Pulses: Normal pulses.     Heart sounds: Normal heart sounds. No murmur heard. Pulmonary:     Effort: Pulmonary effort is normal. No respiratory distress.     Breath sounds: Normal breath sounds. No wheezing.  Musculoskeletal:        General: Tenderness present.  Skin:    Comments: Left upper extremity with firm area noted medial arm   Neurological:     General: No focal deficit present.     Mental Status: She is alert and oriented to person, place, and time.     Cranial Nerves: No cranial nerve deficit.     Motor: No weakness.  Psychiatric:        Mood and Affect: Mood normal.        Behavior: Behavior normal.        Thought Content: Thought content normal.        Judgment: Judgment normal.        Assessment And Plan:     1. Paresthesia Comments: Continues to have parasthesias will check CT scan of brain.  - CT HEAD WO CONTRAST ( ); Future - Magnesium 200 MG TABS; Take 1 tablet (200 mg total) by mouth daily. Take with evening meal  Dispense: 30 tablet; Refill: 1  2. Abnormal EKG Comments: new QT prolongation at [facility] ER and now having elevated blood pressure will refer to Cardiology - Ambulatory referral to Cardiology  3. Elevated blood pressure reading Comments: Blood pressure is fairly controlled at this time. She is to take magnesium supplement with evening meal.  - Magnesium 200 MG TABS; Take 1 tablet (200 mg total) by mouth daily. Take with evening meal  Dispense: 30 tablet; Refill: 1  4. Mass of left upper extremity Comments: She has an area to her left upper extremity that is firm, she has had several ultrasounds in the past. She is to contact the Dermatologist to evaluate - diclofenac Sodium (VOLTAREN) 1 % GEL; Apply 2 g topically 4 (four) times daily.  Dispense: 100 g; Refill: 2  5. Influenza vaccination declined  6. Herpes zoster vaccination declined     Patient was given opportunity to ask questions. Patient verbalized understanding of the plan and was able to repeat key elements of the plan. All questions were answered to their satisfaction.  [name], FNP   I, [name], FNP, have reviewed all documentation for this visit. The documentation on 07/17/2021 for the exam, diagnosis, procedures, and orders are all accurate and complete.   IF YOU HAVE BEEN REFERRED TO A SPECIALIST, IT  MAY TAKE 1-2 WEEKS TO SCHEDULE/PROCESS THE REFERRAL. IF YOU HAVE NOT HEARD FROM US/SPECIALIST IN TWO WEEKS, PLEASE GIVE [place] A CALL AT [phone].   THE PATIENT IS ENCOURAGED TO PRACTICE SOCIAL DISTANCING DUE TO THE COVID-19 PANDEMIC.

## 2021-07-25 ENCOUNTER — Ambulatory Visit
Admission: RE | Admit: 2021-07-25 | Discharge: 2021-07-25 | Disposition: A | Discharge status: Home / Self Care | Payer: Commercial Managed Care - PPO | Source: Ambulatory Visit | Attending: Nurse Practitioner | Admitting: Nurse Practitioner

## 2021-07-25 ENCOUNTER — Telehealth: Payer: Self-pay

## 2021-07-25 ENCOUNTER — Other Ambulatory Visit: Payer: Commercial Managed Care - PPO

## 2021-07-25 DIAGNOSIS — R202 Paresthesia of skin: Secondary | ICD-10-CM

## 2021-07-25 NOTE — Telephone Encounter (Signed)
Patient called regarding her Ct scan that was done today on 07/25/21 saying she saw something alarming. I returned her call after speaking with Minette Brine DNP,FNP-BC and advised her that her CT was normal. Tyler Deis

## 2021-07-29 ENCOUNTER — Encounter: Payer: Commercial Managed Care - PPO | Admitting: Nurse Practitioner

## 2021-08-13 LAB — HM PAP SMEAR

## 2021-08-23 ENCOUNTER — Encounter: Payer: Self-pay | Admitting: Nurse Practitioner

## 2021-09-05 ENCOUNTER — Ambulatory Visit: Payer: Commercial Managed Care - PPO | Admitting: Cardiology

## 2021-10-21 ENCOUNTER — Ambulatory Visit
Admission: RE | Admit: 2021-10-21 | Discharge: 2021-10-21 | Disposition: A | Discharge status: Home / Self Care | Payer: Managed Care, Other (non HMO) | Source: Ambulatory Visit | Attending: Pulmonary Disease | Admitting: Pulmonary Disease

## 2021-10-21 DIAGNOSIS — R918 Other nonspecific abnormal finding of lung field: Secondary | ICD-10-CM

## 2021-11-12 ENCOUNTER — Ambulatory Visit: Payer: Managed Care, Other (non HMO) | Admitting: Cardiology

## 2021-11-19 ENCOUNTER — Encounter: Payer: Commercial Managed Care - PPO | Admitting: Nurse Practitioner

## 2021-11-19 ENCOUNTER — Ambulatory Visit: Payer: Self-pay | Admitting: Pulmonary Disease

## 2021-11-26 NOTE — Progress Notes (Deleted)
?Cardiology Office Note:   ? ?Date:  11/26/2021  ? ?ID:  Katrina Tucker, DOB 07-Nov-1965, MRN [id number] ? ?PCP:  [name], FNP  ?[facility] Cardiologist:  Meriam Sprague, MD  ?[facility] Electrophysiologist:  None  ? ?Referring MD: [name], FNP  ? ? ?History of Present Illness:   ? ?Katrina Tucker is a 56 y.o. female with history of pulmonary nodules who returns to clinic for follow-up.  ? ?In 06/2019, she had an episode of chest pressure and was evaluated at [facility] where cardiac work-up was reassuringly normal. CTA at that time showed no evidence of aortic aneurysm, dissection or significant atherosclerosis but was notable for several lung nodules; was recommended for follow-up in 3-6 months.  She followed up in [place] for repeat CT scan where they were unchanged in size. She was told that she needs to follow-up for further imaging now which is why she initially was referred to Cardiology clinic.  ? ?Was last seen in 10/2020 where she was doing well from a CV standpoint. We referred her to [facility] for monitoring of her pulmonary nodules.  ? ?Today, the patient states that her blood pressure has been elevated at home running 150s when she was previously in the 120s. Notably, she is postmenopausal from a hysterectomy in 2017.  ? ?Past Medical History:  ?Diagnosis Date  ? Anemia   ? Headache   ? history of   ? UTI (lower urinary tract infection)   ? ? ?Past Surgical History:  ?Procedure Laterality Date  ? ABDOMINAL HYSTERECTOMY Bilateral 08/20/2015  ? Procedure: TOTAL ABDOMINAL HYSTERECTOMY ;  Surgeon: Kirkland Hun, MD;  Location: [facility];  Service: Gynecology;  Laterality: Bilateral;  ? BILATERAL SALPINGECTOMY Bilateral 08/20/2015  ? Procedure: BILATERAL SALPINGECTOMY;  Surgeon: Kirkland Hun, MD;  Location: [facility];  Service: Gynecology;  Laterality: Bilateral;  ? BREAST SURGERY    ? biopsy feb 2014 left breast 2000 right breast   ? CYSTOSCOPY  08/20/2015  ? Procedure: CYSTOSCOPY;  Surgeon: Kirkland Hun, MD;  Location: [facility];  Service: Gynecology;;  ? MOUTH SURGERY    ? ? ?Current Medications: ?No outpatient medications have been marked as taking for the 11/28/21 encounter (Appointment) with Meriam Sprague, MD.  ?  ? ?Allergies:   Patient has no known allergies.  ? ?Social History  ? ?Socioeconomic History  ? Marital status: Single  ?  Spouse name: Not on file  ? Number of children: Not on file  ? Years of education: Not on file  ? Highest education level: Not on file  ?Occupational History  ? Not on file  ?Tobacco Use  ? Smoking status: Never  ?  Passive exposure: Yes  ? Smokeless tobacco: Never  ? Tobacco comments:  ?  mother smoked in home growing up.   ?Substance and Sexual Activity  ? Alcohol use: Yes  ?  Alcohol/week: 7.0 - 14.0 standard drinks  ?  Types: 7 - 14 Glasses of wine per week  ? Drug use: No  ? Sexual activity: Yes  ?  Birth control/protection: Surgical  ?Other Topics Concern  ? Not on file  ?Social History Narrative  ? Not on file  ? ?Social Determinants of Health  ? ?Financial Resource Strain: Not on file  ?Food Insecurity: Not on file  ?Transportation Needs: Not on file  ?Physical Activity: Not on file  ?Stress: Not on file  ?Social Connections: Not on file  ?  ? ?Family History: ?[name] family  history is not on file. ? ?ROS:   ?Please see the history of present illness.    ?Review of Systems  ?Constitutional:  Negative for chills and fever.  ?HENT:  Negative for congestion.   ?Eyes:  Negative for blurred vision.  ?Respiratory:  Negative for shortness of breath.   ?Cardiovascular:  Negative for chest pain, palpitations, orthopnea, claudication, leg swelling and PND.  ?Gastrointestinal:  Negative for nausea and vomiting.  ?Genitourinary:  Negative for dysuria.  ?Musculoskeletal:  Negative for myalgias.  ?Neurological:  Negative for dizziness and loss of consciousness.  ?Endo/Heme/Allergies:  Negative for polydipsia.  ?Psychiatric/Behavioral:  Negative for substance abuse.    ? ?EKGs/Labs/Other Studies Reviewed:   ? ?The following studies were reviewed today: ?CTA chest 06/2019: ?FINDINGS:  ?- Thoracic Aorta: Normal in caliber without aneurysm. No dissection or  ?pseudoaneurysm. No significant atherosclerotic calcification.  ? ?- Right Brachiocephalic Artery: Patent. No significant atherosclerotic  ?calcification.  ?- Right Common Carotid Artery: Patent. No significant atherosclerotic  ?calcification.  ?- Right Subclavian Artery: Patent. No significant atherosclerotic  ?calcification.  ?- Right Vertebral Artery: Patent. No significant atherosclerotic  ?calcification.  ? ?- Left Common Carotid Artery: Patent. No significant atherosclerotic  ?calcification.  ?- Left Subclavian Artery: Patent. No significant atherosclerotic  ?calcification.  ?- Left Vertebral Artery: Patent. No significant atherosclerotic  ?calcification.  ? ?- SVC And Thoracic Veins: Unremarkable.  ? ?NONVASCULAR FINDINGS  ?- Airways: Patent.  ?- Lungs: Mild bibasilar atelectasis. Multiple pulmonary nodules with the  ?largest/most representative nodules as follows:  ?*  4 mm solid nodule, RIGHT upper lobe (series 8, image 230).  ?*  2 mm solid nodule, RIGHT lower lobe (series 8, image 323).  ?*  2 mm solid nodule, LEFT upper lobe (series 8, image 134).  ?*  7 mm mixed solid and groundglass nodule, LEFT lower lobe (series 8,  ?image 276).  ?- Pleura: No effusion.  ? ?- Neck Base: Unremarkable.  ?- Thyroid: Unremarkable.  ?- Axilla: No lymphadenopathy.  ? ?- Mediastinum/Hila: No lymphadenopathy.  ?- Heart: Tricuspid aortic valve. No pericardial effusion.  ? ?- Upper Abdomen: Multiple low attenuation foci in the liver, the largest of  ?which are fluid attenuation suggesting cysts.  ?- Soft Tissues: Unremarkable.  ?- Bones: No aggressive appearing lesions.  ? ? IMPRESSION:  ?1.  No aortic aneurysm, dissection, or intramural hematoma.  ?2.  No etiology to explain chest pain.  ?3.  Scattered pulmonary nodules including a 7 mm  mixed solid and  ?groundglass nodule in the LEFT lower lobe. Recommend follow-up CT in 3-6  ?months to confirm persistence.  (2017 Fleischner Guidelines)  ? ? ?EKG:  EKG is  ordered today.  The ekg ordered today demonstrates NSR with HR 62bpm ? ?Recent Labs: ?No results found for requested labs within last 8760 hours.  ?Recent Lipid Panel ?   ?Component Value Date/Time  ? CHOL 234 (H) 07/25/2020 1058  ? TRIG 97 07/25/2020 1058  ? HDL 58 07/25/2020 1058  ? CHOLHDL 4.0 07/25/2020 1058  ? LDLCALC 159 (H) 07/25/2020 1058  ? ? ? ? ?Physical Exam:   ? ?VS:  LMP 07/26/2015 (Exact Date)    ? ?Wt Readings from Last 3 Encounters:  ?07/17/21 179 lb 12.8 oz (81.6 kg)  ?11/16/20 191 lb 6.4 oz (86.8 kg)  ?08/02/20 192 lb 12.8 oz (87.5 kg)  ?  ? ?GEN:  Well nourished, well developed in no acute distress ?HEENT: Normal ?NECK: No JVD; No carotid bruits ?CARDIAC: RRR, no  murmurs, rubs, gallops ?RESPIRATORY:  Clear to auscultation without rales, wheezing or rhonchi  ?ABDOMEN: Soft, non-tender, non-distended ?MUSCULOSKELETAL:  No edema; No deformity  ?SKIN: Warm and dry ?NEUROLOGIC:  Alert and oriented x 3 ?PSYCHIATRIC:  Normal affect  ? ?ASSESSMENT:   ? ?No diagnosis found. ? ?PLAN:   ? ?In order of problems listed above: ? ?#Pulmonary Nodules: ?Followed by Pulm. Stable on CT chest.  ?-Follow-up with Pulm as scheduled. ? ?#HLD: ?Declined statin at last visit. Last LDL 159. ? ? ?Exercise recommendations: ?Goal of exercising for at least 30 minutes a day, at least 5 times per week.  Please exercise to a moderate exertion.  This means that while exercising it is difficult to speak in full sentences, however you are not so short of breath that you feel you must stop, and not so comfortable that you can carry on a full conversation.  Exertion level should be approximately a 5/10, if 10 is the most exertion you can perform. ? ?Diet recommendations: ?Recommend a heart healthy diet such as the Mediterranean diet.  This diet consists of plant  based foods, healthy fats, lean meats, olive oil.  It suggests limiting the intake of simple carbohydrates such as white breads, pastries, and pastas.  It also limits the amount of red meat, wine, and d

## 2021-11-28 ENCOUNTER — Ambulatory Visit: Payer: Managed Care, Other (non HMO) | Admitting: Cardiology

## 2021-11-28 ENCOUNTER — Encounter: Payer: Self-pay | Admitting: Cardiology

## 2021-11-28 VITALS — BP 152/90 | HR 75 | Ht 66.0 in | Wt 181.0 lb

## 2021-11-28 DIAGNOSIS — E7800 Pure hypercholesterolemia, unspecified: Secondary | ICD-10-CM | POA: Diagnosis not present

## 2021-11-28 DIAGNOSIS — R030 Elevated blood-pressure reading, without diagnosis of hypertension: Secondary | ICD-10-CM | POA: Diagnosis not present

## 2021-11-28 DIAGNOSIS — R918 Other nonspecific abnormal finding of lung field: Secondary | ICD-10-CM | POA: Diagnosis not present

## 2021-11-28 NOTE — Progress Notes (Signed)
?Cardiology Office Note:   ? ?Date:  11/28/2021  ? ?ID:  Katrina Tucker, DOB Dec 15, 1965, MRN [id number] ? ?PCP:  [name], FNP  ?[facility] Cardiologist:  Meriam Sprague, MD  ?[facility] Electrophysiologist:  None  ? ?Referring MD: [name], FNP  ? ? ?History of Present Illness:   ? ?Katrina Tucker is a 56 y.o. female with history of pulmonary nodules who returns to clinic for follow-up.  ? ?In 06/2019, she had an episode of chest pressure and was evaluated at [facility] where cardiac work-up was reassuringly normal. CTA at that time showed no evidence of aortic aneurysm, dissection or significant atherosclerosis but was notable for several lung nodules; was recommended for follow-up in 3-6 months.  She followed up in [place] for repeat CT scan where they were unchanged in size. She was told that she needs to follow-up for further imaging now which is why she initially was referred to Cardiology clinic.  ? ?Was last seen in 10/2020 where she was doing well from a CV standpoint. We referred her to [facility] for monitoring of her pulmonary nodules.  ? ?Today, she states that she is doing well, however, she reports that her blood pressure has been abnormal. It has recently been recently been around the 150's for the past year. She states that she takes Magnesium to help but it has been persistently elevated. She is very active and watches her salt. Has strong family history of HTN. She would like to avoid antihypertensives if possible.  ? ?Otherwise, she is doing very well. The patient denies chest pain, shortness of breath, nocturnal dyspnea, orthopnea or peripheral edema.  No lightheadedness or syncope.  ? ?Past Medical History:  ?Diagnosis Date  ? Anemia   ? Headache   ? history of   ? UTI (lower urinary tract infection)   ? ? ?Past Surgical History:  ?Procedure Laterality Date  ? ABDOMINAL HYSTERECTOMY Bilateral 08/20/2015  ? Procedure: TOTAL ABDOMINAL HYSTERECTOMY ;  Surgeon: Kirkland Hun, MD;   Location: [facility];  Service: Gynecology;  Laterality: Bilateral;  ? BILATERAL SALPINGECTOMY Bilateral 08/20/2015  ? Procedure: BILATERAL SALPINGECTOMY;  Surgeon: Kirkland Hun, MD;  Location: [facility];  Service: Gynecology;  Laterality: Bilateral;  ? BREAST SURGERY    ? biopsy feb 2014 left breast 2000 right breast   ? CYSTOSCOPY  08/20/2015  ? Procedure: CYSTOSCOPY;  Surgeon: Kirkland Hun, MD;  Location: [facility];  Service: Gynecology;;  ? MOUTH SURGERY    ? ? ?Current Medications: ?Current Meds  ?Medication Sig  ? Ascorbic Acid (VITAMIN C PO) Take by mouth daily at 2 PM.  ? Magnesium 200 MG TABS Take 1 tablet (200 mg total) by mouth daily. Take with evening meal  ? Multiple Vitamin (MULTIVITAMIN WITH MINERALS) TABS tablet Take 1 tablet by mouth daily.  ? Turmeric 400 MG CAPS Take 1 capsule by mouth daily.  ? VITAMIN D PO   ? zinc gluconate 50 MG tablet Take 50 mg by mouth daily.  ?  ? ?Allergies:   Patient has no known allergies.  ? ?Social History  ? ?Socioeconomic History  ? Marital status: Single  ?  Spouse name: Not on file  ? Number of children: Not on file  ? Years of education: Not on file  ? Highest education level: Not on file  ?Occupational History  ? Not on file  ?Tobacco Use  ? Smoking status: Never  ?  Passive exposure: Yes  ? Smokeless tobacco: Never  ?  Tobacco comments:  ?  mother smoked in home growing up.   ?Substance and Sexual Activity  ? Alcohol use: Yes  ?  Alcohol/week: 7.0 - 14.0 standard drinks  ?  Types: 7 - 14 Glasses of wine per week  ? Drug use: No  ? Sexual activity: Yes  ?  Birth control/protection: Surgical  ?Other Topics Concern  ? Not on file  ?Social History Narrative  ? Not on file  ? ?Social Determinants of Health  ? ?Financial Resource Strain: Not on file  ?Food Insecurity: Not on file  ?Transportation Needs: Not on file  ?Physical Activity: Not on file  ?Stress: Not on file  ?Social Connections: Not on file  ?  ? ?Family History: ?[name] family history is not on  file. ? ?ROS:   ?Please see the history of present illness. ?Review of Systems  ?Constitutional:  Negative for malaise/fatigue.  ?Respiratory:  Negative for shortness of breath.   ?Cardiovascular:  Negative for chest pain, palpitations, orthopnea, claudication, leg swelling and PND.  ?Gastrointestinal:  Negative for heartburn.  ?Neurological:  Negative for dizziness and loss of consciousness.  ?Psychiatric/Behavioral:  Negative for substance abuse.    ? ?EKGs/Labs/Other Studies Reviewed:   ? ?The following studies were reviewed today: ? ?CTA chest 06/2019: ?FINDINGS:  ?- Thoracic Aorta: Normal in caliber without aneurysm. No dissection or  ?pseudoaneurysm. No significant atherosclerotic calcification.  ? ?- Right Brachiocephalic Artery: Patent. No significant atherosclerotic  ?calcification.  ?- Right Common Carotid Artery: Patent. No significant atherosclerotic  ?calcification.  ?- Right Subclavian Artery: Patent. No significant atherosclerotic  ?calcification.  ?- Right Vertebral Artery: Patent. No significant atherosclerotic  ?calcification.  ? ?- Left Common Carotid Artery: Patent. No significant atherosclerotic  ?calcification.  ?- Left Subclavian Artery: Patent. No significant atherosclerotic  ?calcification.  ?- Left Vertebral Artery: Patent. No significant atherosclerotic  ?calcification.  ? ?- SVC And Thoracic Veins: Unremarkable.  ? ?NONVASCULAR FINDINGS  ?- Airways: Patent.  ?- Lungs: Mild bibasilar atelectasis. Multiple pulmonary nodules with the  ?largest/most representative nodules as follows:  ?*  4 mm solid nodule, RIGHT upper lobe (series 8, image 230).  ?*  2 mm solid nodule, RIGHT lower lobe (series 8, image 323).  ?*  2 mm solid nodule, LEFT upper lobe (series 8, image 134).  ?*  7 mm mixed solid and groundglass nodule, LEFT lower lobe (series 8,  ?image 276).  ?- Pleura: No effusion.  ? ?- Neck Base: Unremarkable.  ?- Thyroid: Unremarkable.  ?- Axilla: No lymphadenopathy.  ? ?- Mediastinum/Hila:  No lymphadenopathy.  ?- Heart: Tricuspid aortic valve. No pericardial effusion.  ? ?- Upper Abdomen: Multiple low attenuation foci in the liver, the largest of  ?which are fluid attenuation suggesting cysts.  ?- Soft Tissues: Unremarkable.  ?- Bones: No aggressive appearing lesions.  ? ? IMPRESSION:  ?1.  No aortic aneurysm, dissection, or intramural hematoma.  ?2.  No etiology to explain chest pain.  ?3.  Scattered pulmonary nodules including a 7 mm mixed solid and  ?groundglass nodule in the LEFT lower lobe. Recommend follow-up CT in 3-6  ?months to confirm persistence.  (2017 Fleischner Guidelines)  ? ? ?EKG:  11/28/2021 EKG: Rate 62. Normal Sinus Rhythm. ? ? ?Recent Labs: ?No results found for requested labs within last 8760 hours.  ?Recent Lipid Panel ?   ?Component Value Date/Time  ? CHOL 234 (H) 07/25/2020 1058  ? TRIG 97 07/25/2020 1058  ? HDL 58 07/25/2020 1058  ? CHOLHDL  4.0 07/25/2020 1058  ? LDLCALC 159 (H) 07/25/2020 1058  ? ? ? ? ?Physical Exam:   ? ?VS:  BP (!) 152/90   Pulse 75   Ht 5\' 6"  (1.676 m)   Wt 181 lb (82.1 kg)   LMP 07/26/2015 (Exact Date)   SpO2 99%   BMI 29.21 kg/m?    ? ?Wt Readings from Last 3 Encounters:  ?11/28/21 181 lb (82.1 kg)  ?07/17/21 179 lb 12.8 oz (81.6 kg)  ?11/16/20 191 lb 6.4 oz (86.8 kg)  ?  ? ?GEN:  Well nourished, well developed in no acute distress ?HEENT: Normal ?NECK: No JVD; No carotid bruits ?CARDIAC: RRR, no murmurs, rubs, gallops ?RESPIRATORY:  Clear to auscultation without rales, wheezing or rhonchi  ?ABDOMEN: Soft, non-tender, non-distended ?MUSCULOSKELETAL:  No edema; No deformity  ?SKIN: Warm and dry ?NEUROLOGIC:  Alert and oriented x 3 ?PSYCHIATRIC:  Normal affect  ? ?ASSESSMENT:   ? ?1. Elevated blood pressure reading in office without diagnosis of hypertension   ?2. Pure hypercholesterolemia   ?3. Multiple lung nodules on CT   ? ?PLAN:   ? ?In order of problems listed above: ? ?#Elevated Blood Pressure:  ?Suspect she may have developed essential HTN  in the setting of strong family history of hypertension. She is active and exercises regularly. Maintains a healthy diet with no significant salt use. She would like to avoid antihypertensives if possible but I think she

## 2021-11-28 NOTE — Patient Instructions (Signed)
Medication Instructions:  ? ?Your physician recommends that you continue on your current medications as directed. Please refer to the Current Medication list given to you today. ? ?*If you need a refill on your cardiac medications before your next appointment, please call your pharmacy* ? ? ?Follow-Up: ?At [facility], you and your health needs are our priority.  As part of our continuing mission to provide you with exceptional heart care, we have created designated Provider Care Teams.  These Care Teams include your primary Cardiologist (physician) and Advanced Practice Providers (APPs -  Physician Assistants and Nurse Practitioners) who all work together to provide you with the care you need, when you need it. ? ?We recommend signing up for the patient portal called "MyChart".  Sign up information is provided on this After Visit Summary.  MyChart is used to connect with patients for Virtual Visits (Telemedicine).  Patients are able to view lab/test results, encounter notes, upcoming appointments, etc.  Non-urgent messages can be sent to your provider as well.   ?To learn more about what you can do with MyChart, go to [email or web address].   ? ?Your next appointment:   ?1 year(s) ? ?The format for your next appointment:   ?In Person ? ?Provider:   ?Freada Bergeron, MD   ? ? ?Other Instructions ? ?KEEP A LOG OF YOUR BLOOD PRESSURES FOR THE NEXT 2 WEEKS AND SEND THOSE READINGS TO DR. Johney Frame VIA MYCHART OR CALL THEM INTO THE OFFICE AT [phone] THEREAFTER. ? ?Important Information About Sugar ? ? ? ? ? ? ?

## 2021-12-10 ENCOUNTER — Encounter: Payer: Self-pay | Admitting: Pulmonary Disease

## 2021-12-10 ENCOUNTER — Ambulatory Visit: Payer: Managed Care, Other (non HMO) | Admitting: Pulmonary Disease

## 2021-12-10 VITALS — BP 142/80 | HR 68 | Temp 98.2°F | Ht 66.0 in | Wt 181.8 lb

## 2021-12-10 DIAGNOSIS — R918 Other nonspecific abnormal finding of lung field: Secondary | ICD-10-CM | POA: Diagnosis not present

## 2021-12-10 NOTE — Progress Notes (Signed)
Katrina Tucker    [id number]    Apr 21, 1966  Primary Care Physician:Moore, Lolita Cram, FNP  Referring Physician: [name], FNP [address] [place],  [place] [address]  Chief complaint: Follow-up for abnormal CT  HPI: 56 year old with no significant past medical history Here for evaluation of lung nodules  She had a CT scan done at the ED visit to [facility] in December 2020 for chest pain, dyspnea.  She was tested negative for COVID.  CT scan at that time showed subcentimeter pulmonary nodules She subsequently had a follow-up CT in March 2022 with redemonstration of pulmonary nodules and has been referred here for further evaluation  States that overall her breathing is doing well.  She has occasional feelings of chest heaviness.  Denies any cough, congestion, fevers, chills  Pets: No pets Occupation: Works as an Production designer, theatre/television/film Exposures: No mold, hot tub, Financial controller.  She has down pillows Smoking history: Never smoker Travel history: Originally from [place].  Previously lived in [place].  No significant recent travel Relevant family history: Father had lung cancer.  He was a smoker.  Interim history: Here for follow-up of CT scan. States that breathing is doing well with no issues  Outpatient Encounter Medications as of 12/10/2021  Medication Sig   Ascorbic Acid (VITAMIN C PO) Take by mouth daily at 2 PM.   Magnesium 200 MG TABS Take 1 tablet (200 mg total) by mouth daily. Take with evening meal   Multiple Vitamin (MULTIVITAMIN WITH MINERALS) TABS tablet Take 1 tablet by mouth daily.   Turmeric 400 MG CAPS Take 1 capsule by mouth daily.   VITAMIN D PO    zinc gluconate 50 MG tablet Take 50 mg by mouth daily.   No facility-administered encounter medications on file as of 12/10/2021.   Physical Exam: Blood pressure (!) 142/80, pulse 68, temperature 98.2 F (36.8 C), temperature source Oral, height 5\' 6"  (1.676 m), weight 181 lb 12.8 oz (82.5 kg), last menstrual  period 07/26/2015, SpO2 98 %. Gen:      No acute distress HEENT:  EOMI, sclera anicteric Neck:     No masses; no thyromegaly Lungs:    Clear to auscultation bilaterally; normal respiratory effort CV:         Regular rate and rhythm; no murmurs Abd:      + bowel sounds; soft, non-tender; no palpable masses, no distension Ext:    No edema; adequate peripheral perfusion Skin:      Warm and dry; no rash Neuro: alert and oriented x 3 Psych: normal mood and affect   Data Reviewed: Imaging: CTA [facility] 07/19/2019- 1.  No aortic aneurysm, dissection, or intramural hematoma.  2.  No etiology to explain chest pain.  3.  Scattered pulmonary nodules including a 7 mm mixed solid and  groundglass nodule in the LEFT lower lobe  CT chest 09/27/2020- 5 mm left lower lobe nodule, 3 mm right middle lobe nodule.  Low-attenuation lesion in the pancreas.  Reviewed images personally.  MRI abdomen 09/27/2020- pancreatic cyst, multiple small hepatic cysts  CT chest 10/21/21 - stable lung nodules. I have reviewed the images personally.  PFTs:  Labs:  Assessment:  Pulmonary nodules Lung nodules have remained stable and likely benign I have reassured the patient.  Due to anxiety regarding this findings we will follow-up with a CT scan in 1 year  If stable next year then we would have demonstrated 2 year stability and we can stop  further scans  Plan/Recommendations: CT scan without contrast in 1 year.  Chilton Greathouse MD [facility] 12/10/2021, 4:19 PM  CC: [name], FNP

## 2021-12-10 NOTE — Patient Instructions (Signed)
Glad you're doing well with regard to your breathing your CT scan showed stable lung nodules which is likely benign we get a follow-up CT without contrast in one year for follow-up return to clinic after scan

## 2021-12-10 NOTE — Addendum Note (Signed)
Addended by: Elton Sin on: 12/10/2021 04:50 PM   Modules accepted: Orders

## 2022-03-25 ENCOUNTER — Other Ambulatory Visit: Payer: Self-pay

## 2022-03-25 DIAGNOSIS — Z1231 Encounter for screening mammogram for malignant neoplasm of breast: Secondary | ICD-10-CM

## 2022-03-27 ENCOUNTER — Ambulatory Visit: Payer: Managed Care, Other (non HMO) | Admitting: Nurse Practitioner

## 2022-03-31 ENCOUNTER — Other Ambulatory Visit: Payer: Self-pay | Admitting: Nurse Practitioner

## 2022-03-31 DIAGNOSIS — Z1231 Encounter for screening mammogram for malignant neoplasm of breast: Secondary | ICD-10-CM

## 2022-03-31 DIAGNOSIS — R229 Localized swelling, mass and lump, unspecified: Secondary | ICD-10-CM

## 2022-03-31 NOTE — Addendum Note (Signed)
Addended by: Minette Brine F on: 03/31/2022 05:18 PM   Modules accepted: Orders

## 2022-03-31 NOTE — Progress Notes (Signed)
Order for diagnostic mammogram sent to referral for [facility]

## 2022-05-13 LAB — HM MAMMOGRAPHY

## 2022-05-14 ENCOUNTER — Encounter: Payer: Self-pay | Admitting: Nurse Practitioner

## 2022-05-14 ENCOUNTER — Ambulatory Visit: Payer: Managed Care, Other (non HMO) | Admitting: Nurse Practitioner

## 2022-05-14 VITALS — BP 130/80 | HR 77 | Temp 98.0°F | Ht 66.0 in | Wt 178.0 lb

## 2022-05-14 DIAGNOSIS — E663 Overweight: Secondary | ICD-10-CM | POA: Diagnosis not present

## 2022-05-14 DIAGNOSIS — R3129 Other microscopic hematuria: Secondary | ICD-10-CM | POA: Diagnosis not present

## 2022-05-14 DIAGNOSIS — M545 Low back pain, unspecified: Secondary | ICD-10-CM

## 2022-05-14 DIAGNOSIS — Z0000 Encounter for general adult medical examination without abnormal findings: Secondary | ICD-10-CM | POA: Diagnosis not present

## 2022-05-14 DIAGNOSIS — R300 Dysuria: Secondary | ICD-10-CM

## 2022-05-14 DIAGNOSIS — H6123 Impacted cerumen, bilateral: Secondary | ICD-10-CM | POA: Diagnosis not present

## 2022-05-14 DIAGNOSIS — E6609 Other obesity due to excess calories: Secondary | ICD-10-CM

## 2022-05-14 DIAGNOSIS — Z2821 Immunization not carried out because of patient refusal: Secondary | ICD-10-CM

## 2022-05-14 NOTE — Patient Instructions (Signed)

## 2022-05-14 NOTE — Progress Notes (Unsigned)
Katrina Tucker,acting as a Neurosurgeon for Katrina Felts, FNP.,have documented all relevant documentation on the behalf of Katrina Felts, FNP,as directed by  Katrina Felts, FNP while in the presence of Katrina Felts, FNP.   Subjective:     Patient ID: Katrina Tucker , female    DOB: Nov 29, 1965 , 56 y.o.   MRN: [id number]   Chief Complaint  Patient presents with   Annual Exam    HPI  Patient presents today for HM.  Patient states she wants to be checked for a UTI, due to minor back pain and going to urinate more often.   She has been to [facility] for her mammogram yesterday.   BP Readings from Last 3 Encounters: 05/14/22 : 130/80 12/10/21 : (!) 142/80 11/28/21 : (!) 152/90     Past Medical History:  Diagnosis Date   Anemia    Headache    history of    UTI (lower urinary tract infection)      History reviewed. No pertinent family history.   Current Outpatient Medications:    Ascorbic Acid (VITAMIN C PO), Take by mouth daily at 2 PM., Disp: , Rfl:    Magnesium 200 MG TABS, Take 1 tablet (200 mg total) by mouth daily. Take with evening meal, Disp: 30 tablet, Rfl: 1   Multiple Vitamin (MULTIVITAMIN WITH MINERALS) TABS tablet, Take 1 tablet by mouth daily., Disp: , Rfl:    Turmeric 400 MG CAPS, Take 1 capsule by mouth daily., Disp: , Rfl:    VITAMIN D PO, , Disp: , Rfl:    zinc gluconate 50 MG tablet, Take 50 mg by mouth daily., Disp: , Rfl:    No Known Allergies    The patient states has had a hysterectomy. Patient's last menstrual period was 07/26/2015 (exact date).  Negative for Dysmenorrhea and Negative for Menorrhagia. Negative for: breast discharge, breast lump(s), breast pain and breast self exam. Associated symptoms include abnormal vaginal bleeding. Pertinent negatives include abnormal bleeding (hematology), anxiety, decreased libido, depression, difficulty falling sleep, dyspareunia, history of infertility, nocturia, sexual dysfunction, sleep disturbances, urinary incontinence,  urinary urgency, vaginal discharge and vaginal itching. Diet regular- intermittent fasting no limitations. The patient states her exercise level is moderate 3-4 times a week.   [name] tobacco use is:  Social History   Tobacco Use  Smoking Status Never   Passive exposure: Yes  Smokeless Tobacco Never  Tobacco Comments   mother smoked in home growing up.    She has been exposed to passive smoke. [name] alcohol use is:  Social History   Substance and Sexual Activity  Alcohol Use Yes   Alcohol/week: 7.0 - 14.0 standard drinks of alcohol   Types: 7 - 14 Glasses of wine per week     Review of Systems  Constitutional: Negative.   HENT: Negative.    Eyes: Negative.   Respiratory: Negative.    Cardiovascular: Negative.   Gastrointestinal: Negative.   Endocrine: Negative.   Genitourinary: Negative.   Musculoskeletal: Negative.   Skin: Negative.   Allergic/Immunologic: Negative.   Neurological: Negative.   Hematological: Negative.   Psychiatric/Behavioral: Negative.       Today's Vitals   05/14/22 1510  BP: 130/80  Pulse: 77  Temp: 98 F (36.7 C)  TempSrc: Oral  Weight: 178 lb (80.7 kg)  Height: 5\' 6"  (1.676 m)  PainSc: 0-No pain   Body mass index is 28.73 kg/m.  Wt Readings from Last 3 Encounters:  05/14/22 178 lb (80.7 kg)  12/10/21 181 lb 12.8 oz (82.5 kg)  11/28/21 181 lb (82.1 kg)    Objective:  Physical Exam Constitutional:      General: She is not in acute distress.    Appearance: Normal appearance. She is well-developed.  HENT:     Head: Normocephalic and atraumatic.     Right Ear: Hearing, tympanic membrane, ear canal and external ear normal. There is no impacted cerumen.     Left Ear: Hearing, tympanic membrane, ear canal and external ear normal. There is no impacted cerumen.     Nose:     Comments: Deferred - masked    Mouth/Throat:     Comments: Deferred - masked Eyes:     General: Lids are normal.     Extraocular Movements:  Extraocular movements intact.     Conjunctiva/sclera: Conjunctivae normal.     Pupils: Pupils are equal, round, and reactive to light.     Funduscopic exam:    Right eye: No papilledema.        Left eye: No papilledema.  Neck:     Thyroid: No thyroid mass.     Vascular: No carotid bruit.  Cardiovascular:     Rate and Rhythm: Normal rate and regular rhythm.     Pulses: Normal pulses.     Heart sounds: Normal heart sounds. No murmur heard. Pulmonary:     Effort: Pulmonary effort is normal. No respiratory distress.     Breath sounds: Normal breath sounds. No wheezing.  Chest:     Chest wall: No mass.  Breasts:    Tanner Score is 5.     Right: Normal. No mass or tenderness.     Left: Normal. No mass or tenderness.  Abdominal:     General: Abdomen is flat. Bowel sounds are normal. There is no distension.     Palpations: Abdomen is soft.     Tenderness: There is no abdominal tenderness.  Genitourinary:    Comments: Followed by GYN Musculoskeletal:        General: No swelling. Normal range of motion.     Cervical back: Full passive range of motion without pain, normal range of motion and neck supple.     Right lower leg: No edema.     Left lower leg: No edema.  Lymphadenopathy:     Upper Body:     Right upper body: No supraclavicular, axillary or pectoral adenopathy.     Left upper body: No supraclavicular, axillary or pectoral adenopathy.  Skin:    General: Skin is warm and dry.     Capillary Refill: Capillary refill takes less than 2 seconds.  Neurological:     General: No focal deficit present.     Mental Status: She is alert and oriented to person, place, and time.     Cranial Nerves: No cranial nerve deficit.     Sensory: No sensory deficit.     Motor: No weakness.  Psychiatric:        Mood and Affect: Mood normal.        Behavior: Behavior normal.        Thought Content: Thought content normal.        Judgment: Judgment normal.         Assessment And Plan:      1. Annual physical exam Behavior modifications discussed and diet history reviewed.   Pt will continue to exercise regularly and modify diet with low GI, plant based foods and decrease intake of processed foods.  Recommend  intake of daily multivitamin, Vitamin D, and calcium.  Recommend mammogram (recently done at [facility]) and colonoscopy for preventive screenings, as well as recommend immunizations that include influenza, TDAP, and Shingles (declined)  2. Overweight (BMI 25.0 - 29.9) Continue focusing on healthy diet and regular exercise.  3. Acute left-sided low back pain without sciatica - POCT Urinalysis Dipstick (81002)  4. Dysuria Comments: Will send urine culture, encouraged to drink adequate amounts of water. - POCT Urinalysis Dipstick (81002) - Culture, Urine  5. Tetanus, diphtheria, and acellular pertussis (Tdap) vaccination declined Advised on risk of not having Tdap, and aware can call back to office to have done if has a change of mind  6. Other microscopic hematuria  7. Bilateral impacted cerumen Wax is removed by with lavage with elephant ear with 1/2 water and 1/2 peroxide. Instructions for home care to prevent wax buildup are given. - Ear Lavage     Patient was given opportunity to ask questions. Patient verbalized understanding of the plan and was able to repeat key elements of the plan. All questions were answered to their satisfaction.   Katrina Felts, FNP   I, Katrina Felts, FNP, have reviewed all documentation for this visit. The documentation on 05/30/22 for the exam, diagnosis, procedures, and orders are all accurate and complete.   THE PATIENT IS ENCOURAGED TO PRACTICE SOCIAL DISTANCING DUE TO THE COVID-19 PANDEMIC.

## 2022-05-16 LAB — CMP14+EGFR
ALT: 31 IU/L (ref 0–32)
AST: 22 IU/L (ref 0–40)
Albumin/Globulin Ratio: 2 (ref 1.2–2.2)
Albumin: 4.6 g/dL (ref 3.8–4.9)
Alkaline Phosphatase: 79 IU/L (ref 44–121)
BUN/Creatinine Ratio: 16 (ref 9–23)
BUN: 15 mg/dL (ref 6–24)
Bilirubin Total: 0.6 mg/dL (ref 0.0–1.2)
CO2: 26 mmol/L (ref 20–29)
Calcium: 9.7 mg/dL (ref 8.7–10.2)
Chloride: 107 mmol/L — ABNORMAL HIGH (ref 96–106)
Creatinine, Ser: 0.94 mg/dL (ref 0.57–1.00)
Globulin, Total: 2.3 g/dL (ref 1.5–4.5)
Glucose: 81 mg/dL (ref 70–99)
Potassium: 4.3 mmol/L (ref 3.5–5.2)
Sodium: 147 mmol/L — ABNORMAL HIGH (ref 134–144)
Total Protein: 6.9 g/dL (ref 6.0–8.5)
eGFR: 70 mL/min/{1.73_m2} (ref >59)

## 2022-05-16 LAB — URINE CULTURE: Organism ID, Bacteria: NO GROWTH

## 2022-05-16 LAB — LIPID PANEL
Chol/HDL Ratio: 3.2 ratio (ref 0.0–4.4)
Cholesterol, Total: 217 mg/dL — ABNORMAL HIGH (ref 100–199)
HDL: 67 mg/dL (ref >39)
LDL Chol Calc (NIH): 139 mg/dL — ABNORMAL HIGH (ref 0–99)
Triglycerides: 62 mg/dL (ref 0–149)
VLDL Cholesterol Cal: 11 mg/dL (ref 5–40)

## 2022-05-16 LAB — HEMOGLOBIN A1C
Est. average glucose Bld gHb Est-mCnc: 103 mg/dL
Hgb A1c MFr Bld: 5.2 % (ref 4.8–5.6)

## 2022-05-30 LAB — POCT URINALYSIS DIPSTICK
Bilirubin, UA: NEGATIVE
Glucose, UA: NEGATIVE
Ketones, UA: NEGATIVE
Leukocytes, UA: NEGATIVE
Nitrite, UA: NEGATIVE
Protein, UA: POSITIVE — AB
Spec Grav, UA: >=1.03 — AB (ref 1.010–1.025)
Urobilinogen, UA: 0.2 E.U./dL
pH, UA: 6.5 (ref 5.0–8.0)

## 2022-06-01 ENCOUNTER — Other Ambulatory Visit: Payer: Self-pay | Admitting: Nurse Practitioner

## 2022-06-01 DIAGNOSIS — R3121 Asymptomatic microscopic hematuria: Secondary | ICD-10-CM

## 2022-06-01 DIAGNOSIS — R808 Other proteinuria: Secondary | ICD-10-CM

## 2022-06-05 ENCOUNTER — Ambulatory Visit: Payer: Managed Care, Other (non HMO)

## 2022-08-08 DIAGNOSIS — M654 Radial styloid tenosynovitis [de Quervain]: Secondary | ICD-10-CM | POA: Insufficient documentation

## 2022-09-19 ENCOUNTER — Telehealth: Payer: Self-pay | Admitting: Cardiology

## 2022-09-19 ENCOUNTER — Encounter: Payer: Self-pay | Admitting: Cardiology

## 2022-09-19 NOTE — Telephone Encounter (Signed)
Pt c/o BP issue: STAT if pt c/o blurred vision, one-sided weakness or slurred speech  1. What are your last 5 BP readings? 09/19/22 140/100 157/106- Two weeks ago (Urgent Care) 147/dont remember the bottom number (four weeks ago)  2. Are you having any other symptoms (ex. Dizziness, headache, blurred vision, passed out)? Light headed comes and goes, Alittle dizziness  3. What is your BP issue? Pt stated that her BP high lately and wanted to know if she needs to be put on some BP medication. Pt explained that she has been feeling a little dizzy and light headed, but it comes and goes.

## 2022-09-19 NOTE — Telephone Encounter (Signed)
Spoke with pt who states she was seen today in her nephrologist office.  B/P elevated and denies additional symptoms.  Pt reports BP elevated a couple of weeks ago during UC visit.  Pt complains of intermittent lightheadedness over  the past week but states she is being treated for a sinus infection.  She denies current headache,CP, SOB or dizziness.  Pt states she does check her BP at home.  She has an appointment to see Jaquelyn Bitter in the office on 10/31/2022. Requested pt send her last 5 home BP readings though MyChart for further review and recommendation.  Reviewed ED precautions and pt verbalizes understanding and agrees with current plan.

## 2022-09-22 ENCOUNTER — Ambulatory Visit: Payer: Managed Care, Other (non HMO) | Admitting: Nurse Practitioner

## 2022-10-01 ENCOUNTER — Ambulatory Visit: Payer: Managed Care, Other (non HMO) | Admitting: Physician Assistant

## 2022-10-08 ENCOUNTER — Ambulatory Visit: Payer: Managed Care, Other (non HMO) | Admitting: Nurse Practitioner

## 2022-10-09 ENCOUNTER — Encounter: Payer: Self-pay | Admitting: Nurse Practitioner

## 2022-10-09 ENCOUNTER — Ambulatory Visit: Payer: Managed Care, Other (non HMO) | Admitting: Nurse Practitioner

## 2022-10-09 VITALS — BP 138/80 | HR 68 | Temp 97.9°F | Ht 66.0 in | Wt 188.0 lb

## 2022-10-09 DIAGNOSIS — I10 Essential (primary) hypertension: Secondary | ICD-10-CM | POA: Diagnosis not present

## 2022-10-09 DIAGNOSIS — E782 Mixed hyperlipidemia: Secondary | ICD-10-CM

## 2022-10-09 DIAGNOSIS — Z09 Encounter for follow-up examination after completed treatment for conditions other than malignant neoplasm: Secondary | ICD-10-CM

## 2022-10-09 DIAGNOSIS — D259 Leiomyoma of uterus, unspecified: Secondary | ICD-10-CM | POA: Insufficient documentation

## 2022-10-09 MED ORDER — AMLODIPINE BESYLATE 2.5 MG PO TABS: 2.5000 mg | ORAL_TABLET | Freq: Every day | ORAL | 11 refills | Status: DC

## 2022-10-09 NOTE — Patient Instructions (Signed)
-   goal total cholesterol less than 150 and LDL less than 70 for African Americans

## 2022-10-09 NOTE — Progress Notes (Signed)
I,Sheena H Holbrook,acting as a Neurosurgeon for [name], FNP.,have documented all relevant documentation on the behalf of [name], FNP,as directed by  [name], FNP while in the presence of [name], FNP.    Subjective:     Patient ID: Katrina Tucker , female    DOB: February 14, 1966 , 57 y.o.   MRN: [id number]   Chief Complaint  Patient presents with   Hypertension    HPI  Patient presents today for follow after ED visit on 09/21/22. Patient reports she is doing well. She is trying to manage hypertension without medication. She has been taking her blood pressure at home which have been increasing intermittently. She also had a sinus infection and went to the urgent care and was elevated at that time. Approximately 1 month ago she had an episode of syncope and went to urgent care the next day blood pressure was elevated. She has been taking magnesium 500mg  daily.   She is scheduled to see Robin Searing FNP at Cardiology on 10/31/2022.       Past Medical History:  Diagnosis Date   Anemia    Headache    history of    UTI (lower urinary tract infection)      History reviewed. No pertinent family history.   Current Outpatient Medications:    amLODipine (NORVASC) 2.5 MG tablet, Take 1 tablet (2.5 mg total) by mouth daily., Disp: 30 tablet, Rfl: 11   Ascorbic Acid (VITAMIN C PO), Take by mouth daily at 2 PM., Disp: , Rfl:    Magnesium 200 MG TABS, Take 1 tablet (200 mg total) by mouth daily. Take with evening meal, Disp: 30 tablet, Rfl: 1   Multiple Vitamin (MULTIVITAMIN WITH MINERALS) TABS tablet, Take 1 tablet by mouth daily., Disp: , Rfl:    Turmeric 400 MG CAPS, Take 1 capsule by mouth daily., Disp: , Rfl:    VITAMIN D PO, , Disp: , Rfl:    zinc gluconate 50 MG tablet, Take 50 mg by mouth daily., Disp: , Rfl:    No Known Allergies   Review of Systems  Constitutional: Negative.   Respiratory: Negative.    Cardiovascular: Negative.   Neurological: Negative.    Psychiatric/Behavioral: Negative.       Today's Vitals   10/09/22 0836 10/09/22 0914  BP: 136/84 138/80  Pulse: 68   Temp: 97.9 F (36.6 C)   TempSrc: Oral   SpO2: 96%   Weight: 188 lb (85.3 kg)   Height: 5\' 6"  (1.676 m)    Body mass index is 30.34 kg/m.   Objective:  Physical Exam Vitals reviewed.  Constitutional:      General: She is not in acute distress.    Appearance: Normal appearance. She is obese.  Cardiovascular:     Rate and Rhythm: Normal rate and regular rhythm.     Pulses: Normal pulses.     Heart sounds: Normal heart sounds. No murmur heard. Pulmonary:     Effort: Pulmonary effort is normal. No respiratory distress.     Breath sounds: Normal breath sounds.  Skin:    General: Skin is warm and dry.     Capillary Refill: Capillary refill takes less than 2 seconds.  Neurological:     General: No focal deficit present.     Mental Status: She is alert and oriented to person, place, and time.     Cranial Nerves: No cranial nerve deficit.     Motor: No weakness.  Assessment And Plan:     1. Essential hypertension Comments: Blood pressure is slightly elevated today but due to elevated consistently will start on amlodipine at 2.5 - amLODipine (NORVASC) 2.5 MG tablet; Take 1 tablet (2.5 mg total) by mouth daily.  Dispense: 30 tablet; Refill: 11  2. Mixed hyperlipidemia Comments: Cholesterol level had improved at last visit, continue focusing on low fat diet and increasing fiber intake. - Lipid panel     Patient was given opportunity to ask questions. Patient verbalized understanding of the plan and was able to repeat key elements of the plan. All questions were answered to their satisfaction.  [name], FNP   I, [name], FNP, have reviewed all documentation for this visit. The documentation on 10/09/22 for the exam, diagnosis, procedures, and orders are all accurate and complete.   IF YOU HAVE BEEN REFERRED TO A SPECIALIST, IT MAY TAKE  1-2 WEEKS TO SCHEDULE/PROCESS THE REFERRAL. IF YOU HAVE NOT HEARD FROM US/SPECIALIST IN TWO WEEKS, PLEASE GIVE [place] A CALL AT [phone].   THE PATIENT IS ENCOURAGED TO PRACTICE SOCIAL DISTANCING DUE TO THE COVID-19 PANDEMIC.

## 2022-10-10 LAB — LIPID PANEL
Chol/HDL Ratio: 3.2 ratio (ref 0.0–4.4)
Cholesterol, Total: 239 mg/dL — ABNORMAL HIGH (ref 100–199)
HDL: 74 mg/dL (ref >39)
LDL Chol Calc (NIH): 153 mg/dL — ABNORMAL HIGH (ref 0–99)
Triglycerides: 69 mg/dL (ref 0–149)
VLDL Cholesterol Cal: 12 mg/dL (ref 5–40)

## 2022-10-14 ENCOUNTER — Encounter: Payer: Self-pay | Admitting: Nurse Practitioner

## 2022-10-14 ENCOUNTER — Other Ambulatory Visit: Payer: Self-pay | Admitting: Nurse Practitioner

## 2022-10-14 DIAGNOSIS — E782 Mixed hyperlipidemia: Secondary | ICD-10-CM

## 2022-10-20 ENCOUNTER — Inpatient Hospital Stay: Admission: RE | Admit: 2022-10-20 | Payer: Managed Care, Other (non HMO) | Source: Ambulatory Visit

## 2022-10-21 DIAGNOSIS — I10 Essential (primary) hypertension: Secondary | ICD-10-CM | POA: Insufficient documentation

## 2022-10-21 DIAGNOSIS — E782 Mixed hyperlipidemia: Secondary | ICD-10-CM | POA: Insufficient documentation

## 2022-10-22 ENCOUNTER — Other Ambulatory Visit: Payer: Managed Care, Other (non HMO)

## 2022-10-24 ENCOUNTER — Other Ambulatory Visit: Payer: Managed Care, Other (non HMO)

## 2022-10-31 ENCOUNTER — Ambulatory Visit: Payer: Managed Care, Other (non HMO) | Admitting: Nurse Practitioner

## 2022-11-06 ENCOUNTER — Telehealth: Payer: Self-pay | Admitting: Pulmonary Disease

## 2022-11-06 NOTE — Telephone Encounter (Signed)
Called and spoke with patient. She is scheduled for a CT scan on 11/11/22. She wanted to know if she could cancel the CT scan since she had a CXR done at [facility] back in March. I advised her that nodules are not seen on CXRs and Dr. Isaiah Serge needs the CT to make sure the nodules have not changed. She verbalized understanding and will keep the appt for 11/11/22.   Nothing further needed at time of call.

## 2022-11-06 NOTE — Telephone Encounter (Signed)
PT states she had a Chest Xray done 2 mo ago at a [facility]. Can we use that rather than the upcoming CT. Please call her @ [phone]  PS I told her no but she still wants to speak with you. Thanks.

## 2022-11-11 ENCOUNTER — Other Ambulatory Visit: Payer: Managed Care, Other (non HMO)

## 2022-11-19 NOTE — Progress Notes (Unsigned)
Cardiology Office Note:    Date:  11/20/2022   ID:  Katrina Tucker, DOB 1966/01/04, MRN [id number]  PCP:  [name], FNP  [facility] Cardiologist:  Meriam Sprague, MD  [facility] Electrophysiologist:  None   Referring MD: [name], FNP    History of Present Illness:    Katrina Tucker is a 57 y.o. female with history of pulmonary nodules who returns to clinic for follow-up.   In 06/2019, she had an episode of chest pressure and was evaluated at [facility] where cardiac work-up was reassuringly normal. CTA at that time showed no evidence of aortic aneurysm, dissection or significant atherosclerosis but was notable for several lung nodules; was recommended for follow-up in 3-6 months.  She followed up in [place] for repeat CT scan where they were unchanged in size. She was told that she needs to follow-up for further imaging now which is why she initially was referred to Cardiology clinic.   Was last seen in 11/2021 where he BP was elevated to 150s. She wanted to monitor at that time.   Today, the patient states she is stressed today. Her blood pressure is continuing to be elevated and fluctuates frequently at home. No chest pain, LE edema, orthopnea or PND. Exercises regularly without issue. We discussed that starting low dose amlodipine is a great first step and will monitor her BP going forward and can adjust further as needed.  Did not take amlodipine today and BP elevated.  Past Medical History:  Diagnosis Date   Anemia    Headache    history of    UTI (lower urinary tract infection)     Past Surgical History:  Procedure Laterality Date   ABDOMINAL HYSTERECTOMY Bilateral 08/20/2015   Procedure: TOTAL ABDOMINAL HYSTERECTOMY ;  Surgeon: Kirkland Hun, MD;  Location: [facility];  Service: Gynecology;  Laterality: Bilateral;   BILATERAL SALPINGECTOMY Bilateral 08/20/2015   Procedure: BILATERAL SALPINGECTOMY;  Surgeon: Kirkland Hun, MD;  Location: [facility];  Service:  Gynecology;  Laterality: Bilateral;   BREAST SURGERY     biopsy feb 2014 left breast 2000 right breast    CYSTOSCOPY  08/20/2015   Procedure: CYSTOSCOPY;  Surgeon: Kirkland Hun, MD;  Location: [facility];  Service: Gynecology;;   MOUTH SURGERY      Current Medications: Current Meds  Medication Sig   amLODipine (NORVASC) 2.5 MG tablet Take 1 tablet (2.5 mg total) by mouth daily.   Ascorbic Acid (VITAMIN C PO) Take by mouth daily at 2 PM.   Magnesium 200 MG TABS Take 1 tablet (200 mg total) by mouth daily. Take with evening meal   Multiple Vitamin (MULTIVITAMIN WITH MINERALS) TABS tablet Take 1 tablet by mouth daily.   Omega-3 Fatty Acids (FISH OIL) 1000 MG CAPS    Turmeric 400 MG CAPS Take 1 capsule by mouth daily.   VITAMIN D PO    zinc gluconate 50 MG tablet Take 50 mg by mouth daily.     Allergies:   Patient has no known allergies.   Social History   Socioeconomic History   Marital status: Single    Spouse name: Not on file   Number of children: Not on file   Years of education: Not on file   Highest education level: Not on file  Occupational History   Not on file  Tobacco Use   Smoking status: Never    Passive exposure: Yes   Smokeless tobacco: Never   Tobacco comments:    mother smoked  in home growing up.   Substance and Sexual Activity   Alcohol use: Yes    Alcohol/week: 7.0 - 14.0 standard drinks of alcohol    Types: 7 - 14 Glasses of wine per week   Drug use: No   Sexual activity: Yes    Birth control/protection: Surgical  Other Topics Concern   Not on file  Social History Narrative   Not on file   Social Determinants of Health   Financial Resource Strain: Not on file  Food Insecurity: Not on file  Transportation Needs: Not on file  Physical Activity: Not on file  Stress: Not on file  Social Connections: Not on file     Family History: [name] family history is not on file.  ROS:   Please see the history of present illness. Review of  Systems  Constitutional:  Negative for malaise/fatigue.  Respiratory:  Negative for shortness of breath.   Cardiovascular:  Negative for chest pain, palpitations, orthopnea, claudication, leg swelling and PND.  Gastrointestinal:  Negative for heartburn.  Neurological:  Negative for dizziness and loss of consciousness.  Psychiatric/Behavioral:  Negative for substance abuse.      EKGs/Labs/Other Studies Reviewed:    The following studies were reviewed today:  CTA chest 06/2019: FINDINGS:  - Thoracic Aorta: Normal in caliber without aneurysm. No dissection or  pseudoaneurysm. No significant atherosclerotic calcification.   - Right Brachiocephalic Artery: Patent. No significant atherosclerotic  calcification.  - Right Common Carotid Artery: Patent. No significant atherosclerotic  calcification.  - Right Subclavian Artery: Patent. No significant atherosclerotic  calcification.  - Right Vertebral Artery: Patent. No significant atherosclerotic  calcification.   - Left Common Carotid Artery: Patent. No significant atherosclerotic  calcification.  - Left Subclavian Artery: Patent. No significant atherosclerotic  calcification.  - Left Vertebral Artery: Patent. No significant atherosclerotic  calcification.   - SVC And Thoracic Veins: Unremarkable.   NONVASCULAR FINDINGS  - Airways: Patent.  - Lungs: Mild bibasilar atelectasis. Multiple pulmonary nodules with the  largest/most representative nodules as follows:  *  4 mm solid nodule, RIGHT upper lobe (series 8, image 230).  *  2 mm solid nodule, RIGHT lower lobe (series 8, image 323).  *  2 mm solid nodule, LEFT upper lobe (series 8, image 134).  *  7 mm mixed solid and groundglass nodule, LEFT lower lobe (series 8,  image 276).  - Pleura: No effusion.   - Neck Base: Unremarkable.  - Thyroid: Unremarkable.  - Axilla: No lymphadenopathy.   - Mediastinum/Hila: No lymphadenopathy.  - Heart: Tricuspid aortic valve. No  pericardial effusion.   - Upper Abdomen: Multiple low attenuation foci in the liver, the largest of  which are fluid attenuation suggesting cysts.  - Soft Tissues: Unremarkable.  - Bones: No aggressive appearing lesions.    IMPRESSION:  1.  No aortic aneurysm, dissection, or intramural hematoma.  2.  No etiology to explain chest pain.  3.  Scattered pulmonary nodules including a 7 mm mixed solid and  groundglass nodule in the LEFT lower lobe. Recommend follow-up CT in 3-6  months to confirm persistence.  (2017 Fleischner Guidelines)    EKG:  11/28/2021 EKG: Rate 62. Normal Sinus Rhythm.   Recent Labs: 05/15/2022: ALT 31; BUN 15; Creatinine, Ser 0.94; Potassium 4.3; Sodium 147  Recent Lipid Panel    Component Value Date/Time   CHOL 239 (H) 10/09/2022 0918   TRIG 69 10/09/2022 0918   HDL 74 10/09/2022 0918   CHOLHDL 3.2 10/09/2022  7829   LDLCALC 153 (H) 10/09/2022 5621      Physical Exam:    VS:  BP (!) 148/96   Pulse 73   Ht 5\' 6"  (1.676 m)   Wt 183 lb 9.6 oz (83.3 kg)   LMP 07/26/2015 (Exact Date)   SpO2 99%   BMI 29.63 kg/m     Wt Readings from Last 3 Encounters:  11/20/22 183 lb 9.6 oz (83.3 kg)  10/09/22 188 lb (85.3 kg)  05/14/22 178 lb (80.7 kg)     GEN:  Well nourished, well developed in no acute distress HEENT: Normal NECK: No JVD; No carotid bruits CARDIAC: RRR, no murmurs, rubs, gallops RESPIRATORY:  Clear to auscultation without rales, wheezing or rhonchi  ABDOMEN: Soft, non-tender, non-distended MUSCULOSKELETAL:  No edema; No deformity  SKIN: Warm and dry NEUROLOGIC:  Alert and oriented x 3 PSYCHIATRIC:  Normal affect   ASSESSMENT:    1. Primary hypertension   2. Pure hypercholesterolemia   3. Multiple lung nodules on CT    PLAN:    In order of problems listed above:  #HTN -Discussed that I agree with starting amlodipine 2.5mg  daily and we can adjust as needed -Continue healthy lifestyle choices like she is doing -Continue low Na  diet  #Pulmonary Nodules: Followed by Pulm. Stable on CT chest.  -Follow-up with Pulm as scheduled.  #HLD: Declined statin but plans to start red yeast rice. -No significant coronary Ca noted on CT chest -Lipids per PCP -Continue lifestyle modifications as below   Exercise recommendations: Goal of exercising for at least 30 minutes a day, at least 5 times per week.  Please exercise to a moderate exertion.  This means that while exercising it is difficult to speak in full sentences, however you are not so short of breath that you feel you must stop, and not so comfortable that you can carry on a full conversation.  Exertion level should be approximately a 5/10, if 10 is the most exertion you can perform.  Diet recommendations: Recommend a heart healthy diet such as the Mediterranean diet.  This diet consists of plant based foods, healthy fats, lean meats, olive oil.  It suggests limiting the intake of simple carbohydrates such as white breads, pastries, and pastas.  It also limits the amount of red meat, wine, and dairy products such as cheese that one should consume on a daily basis.   Medication Adjustments/Labs and Tests Ordered: Current medicines are reviewed at length with the patient today.  Concerns regarding medicines are outlined above.  Orders Placed This Encounter  Procedures   EKG 12-Lead   No orders of the defined types were placed in this encounter.   Patient Instructions  Medication Instructions:   Your physician recommends that you continue on your current medications as directed. Please refer to the Current Medication list given to you today.  *If you need a refill on your cardiac medications before your next appointment, please call your pharmacy*    Follow-Up: At [facility], you and your health needs are our priority.  As part of our continuing mission to provide you with exceptional heart care, we have created designated Provider Care Teams.  These  Care Teams include your primary Cardiologist (physician) and Advanced Practice Providers (APPs -  Physician Assistants and Nurse Practitioners) who all work together to provide you with the care you need, when you need it.  We recommend signing up for the patient portal called "MyChart".  Sign up information  is provided on this After Visit Summary.  MyChart is used to connect with patients for Virtual Visits (Telemedicine).  Patients are able to view lab/test results, encounter notes, upcoming appointments, etc.  Non-urgent messages can be sent to your provider as well.   To learn more about what you can do with MyChart, go to [email or web address].    Your next appointment:   1 year(s)  Provider:   Meriam Sprague, MD

## 2022-11-20 ENCOUNTER — Encounter: Payer: Self-pay | Admitting: Cardiology

## 2022-11-20 ENCOUNTER — Ambulatory Visit: Payer: Managed Care, Other (non HMO) | Attending: Cardiology | Admitting: Cardiology

## 2022-11-20 VITALS — BP 148/96 | HR 73 | Ht 66.0 in | Wt 183.6 lb

## 2022-11-20 DIAGNOSIS — I10 Essential (primary) hypertension: Secondary | ICD-10-CM

## 2022-11-20 DIAGNOSIS — R918 Other nonspecific abnormal finding of lung field: Secondary | ICD-10-CM | POA: Diagnosis not present

## 2022-11-20 DIAGNOSIS — E7800 Pure hypercholesterolemia, unspecified: Secondary | ICD-10-CM

## 2022-11-20 NOTE — Patient Instructions (Signed)
Medication Instructions:   Your physician recommends that you continue on your current medications as directed. Please refer to the Current Medication list given to you today.  *If you need a refill on your cardiac medications before your next appointment, please call your pharmacy*    Follow-Up: At [facility], you and your health needs are our priority.  As part of our continuing mission to provide you with exceptional heart care, we have created designated Provider Care Teams.  These Care Teams include your primary Cardiologist (physician) and Advanced Practice Providers (APPs -  Physician Assistants and Nurse Practitioners) who all work together to provide you with the care you need, when you need it.  We recommend signing up for the patient portal called "MyChart".  Sign up information is provided on this After Visit Summary.  MyChart is used to connect with patients for Virtual Visits (Telemedicine).  Patients are able to view lab/test results, encounter notes, upcoming appointments, etc.  Non-urgent messages can be sent to your provider as well.   To learn more about what you can do with MyChart, go to [email or web address].    Your next appointment:   1 year(s)  Provider:   Heather E Pemberton, MD       

## 2022-11-27 ENCOUNTER — Ambulatory Visit: Payer: Managed Care, Other (non HMO) | Admitting: Nurse Practitioner

## 2022-11-27 ENCOUNTER — Other Ambulatory Visit: Payer: Managed Care, Other (non HMO)

## 2022-11-28 ENCOUNTER — Ambulatory Visit
Admission: RE | Admit: 2022-11-28 | Discharge: 2022-11-28 | Disposition: A | Discharge status: Home / Self Care | Payer: Managed Care, Other (non HMO) | Source: Ambulatory Visit | Attending: Pulmonary Disease

## 2022-11-28 DIAGNOSIS — R918 Other nonspecific abnormal finding of lung field: Secondary | ICD-10-CM

## 2022-12-03 ENCOUNTER — Encounter: Payer: Self-pay | Admitting: Pulmonary Disease

## 2022-12-10 ENCOUNTER — Ambulatory Visit: Payer: Managed Care, Other (non HMO) | Admitting: Pulmonary Disease

## 2022-12-22 NOTE — Telephone Encounter (Signed)
We can cancel the July visit as no further follow-up is needed for benign lung nodules The liver lesions appear like benign cysts and no follow-up needed for those as well.

## 2023-02-09 ENCOUNTER — Ambulatory Visit: Payer: Self-pay | Admitting: Nurse Practitioner

## 2023-02-09 ENCOUNTER — Ambulatory Visit: Payer: Managed Care, Other (non HMO) | Admitting: Pulmonary Disease

## 2023-02-09 ENCOUNTER — Ambulatory Visit: Payer: Managed Care, Other (non HMO) | Admitting: Dietician

## 2023-02-24 ENCOUNTER — Ambulatory Visit: Payer: Managed Care, Other (non HMO) | Admitting: Nurse Practitioner

## 2023-02-24 NOTE — Progress Notes (Deleted)
Madelaine Bhat, CMA,acting as a Neurosurgeon for [name], FNP.,have documented all relevant documentation on the behalf of [name], FNP,as directed by  [name], FNP while in the presence of [name], FNP.  Subjective:  Patient ID: Katrina Tucker , female    DOB: 07-May-1966 , 57 y.o.   MRN: [id number]  No chief complaint on file.   HPI  Patient presents today for a BP and Chol follow up. Patient reports compliance with medications, patient denies any headache, chest pain or SOB. Patient has no other concerns today.     Past Medical History:  Diagnosis Date  . Anemia   . Headache    history of   . UTI (lower urinary tract infection)      No family history on file.   Current Outpatient Medications:  .  amLODipine (NORVASC) 2.5 MG tablet, Take 1 tablet (2.5 mg total) by mouth daily., Disp: 30 tablet, Rfl: 11 .  Ascorbic Acid (VITAMIN C PO), Take by mouth daily at 2 PM., Disp: , Rfl:  .  Magnesium 200 MG TABS, Take 1 tablet (200 mg total) by mouth daily. Take with evening meal, Disp: 30 tablet, Rfl: 1 .  Multiple Vitamin (MULTIVITAMIN WITH MINERALS) TABS tablet, Take 1 tablet by mouth daily., Disp: , Rfl:  .  Omega-3 Fatty Acids (FISH OIL) 1000 MG CAPS, , Disp: , Rfl:  .  Turmeric 400 MG CAPS, Take 1 capsule by mouth daily., Disp: , Rfl:  .  VITAMIN D PO, , Disp: , Rfl:  .  zinc gluconate 50 MG tablet, Take 50 mg by mouth daily., Disp: , Rfl:    No Known Allergies   Review of Systems  Constitutional: Negative.   HENT: Negative.    Eyes: Negative.   Respiratory: Negative.    Cardiovascular: Negative.   Gastrointestinal: Negative.     There were no vitals filed for this visit. There is no height or weight on file to calculate BMI.  Wt Readings from Last 3 Encounters:  11/20/22 183 lb 9.6 oz (83.3 kg)  10/09/22 188 lb (85.3 kg)  05/14/22 178 lb (80.7 kg)    The 10-year ASCVD risk score (Arnett DK, et al., 2019) is: 9.1%   Values used to calculate the score:      Age: 80 years     Sex: Female     Is Non-Hispanic African American: Yes     Diabetic: No     Tobacco smoker: No     Systolic Blood Pressure: 146 mmHg     Is BP treated: Yes     HDL Cholesterol: 74 mg/dL     Total Cholesterol: 239 mg/dL  Objective:  Physical Exam      Assessment And Plan:  Mixed hyperlipidemia  Essential hypertension    No follow-ups on file.  Patient was given opportunity to ask questions. Patient verbalized understanding of the plan and was able to repeat key elements of the plan. All questions were answered to their satisfaction.    Jeanell Sparrow, FNP, have reviewed all documentation for this visit. The documentation on 02/24/23 for the exam, diagnosis, procedures, and orders are all accurate and complete.   IF YOU HAVE BEEN REFERRED TO A SPECIALIST, IT MAY TAKE 1-2 WEEKS TO SCHEDULE/PROCESS THE REFERRAL. IF YOU HAVE NOT HEARD FROM US/SPECIALIST IN TWO WEEKS, PLEASE GIVE [place] A CALL AT [phone].

## 2023-03-06 ENCOUNTER — Ambulatory Visit (INDEPENDENT_AMBULATORY_CARE_PROVIDER_SITE_OTHER): Payer: Managed Care, Other (non HMO) | Admitting: Nurse Practitioner

## 2023-03-06 ENCOUNTER — Encounter: Payer: Self-pay | Admitting: Nurse Practitioner

## 2023-03-06 VITALS — BP 130/82 | HR 84 | Temp 98.3°F | Ht 66.0 in | Wt 189.6 lb

## 2023-03-06 DIAGNOSIS — Z6831 Body mass index (BMI) 31.0-31.9, adult: Secondary | ICD-10-CM | POA: Insufficient documentation

## 2023-03-06 DIAGNOSIS — E6609 Other obesity due to excess calories: Secondary | ICD-10-CM | POA: Diagnosis not present

## 2023-03-06 DIAGNOSIS — I10 Essential (primary) hypertension: Secondary | ICD-10-CM | POA: Diagnosis not present

## 2023-03-06 DIAGNOSIS — E782 Mixed hyperlipidemia: Secondary | ICD-10-CM | POA: Diagnosis not present

## 2023-03-06 DIAGNOSIS — Z6830 Body mass index (BMI) 30.0-30.9, adult: Secondary | ICD-10-CM

## 2023-03-06 DIAGNOSIS — R0789 Other chest pain: Secondary | ICD-10-CM | POA: Insufficient documentation

## 2023-03-06 LAB — HM MAMMOGRAPHY

## 2023-03-06 MED ORDER — AMLODIPINE BESYLATE 2.5 MG PO TABS
2.5000 mg | ORAL_TABLET | Freq: Every day | ORAL | 1 refills | Status: DC
Start: 2023-03-06 — End: 2023-04-28

## 2023-03-06 NOTE — Assessment & Plan Note (Addendum)
Continue focusing on regular exercise. She is encouraged to strive for BMI less than 30 to decrease cardiac risk. Advised to aim for at least 150 minutes of exercise per week.

## 2023-03-06 NOTE — Assessment & Plan Note (Signed)
Continue amlodipine and focusing on low salt diet. Has recently been a little off track but plans to get back on track. Encouraged to drink at least 64 oz water per day

## 2023-03-06 NOTE — Progress Notes (Signed)
Madelaine Bhat, CMA,acting as a Neurosurgeon for [name], FNP.,have documented all relevant documentation on the behalf of [name], FNP,as directed by  [name], FNP while in the presence of [name], FNP.  Subjective:  Patient ID: Katrina Tucker , female    DOB: 1966-05-13 , 57 y.o.   MRN: [id number]  Chief Complaint  Patient presents with   Hypertension   Hyperlipidemia    HPI  Patient presents today for a BP and Chol follow up. Patient reports compliance with medications, patient denies any headache, chest pain or SOB. Patient has no other concerns today. She had been doing well until she had her wedding and honeymoon. She is just getting back on track since July 4th.        Past Medical History:  Diagnosis Date   Anemia    Headache    history of    UTI (lower urinary tract infection)      History reviewed. No pertinent family history.   Current Outpatient Medications:    Ascorbic Acid (VITAMIN C PO), Take by mouth daily at 2 PM., Disp: , Rfl:    Magnesium 200 MG TABS, Take 1 tablet (200 mg total) by mouth daily. Take with evening meal, Disp: 30 tablet, Rfl: 1   Multiple Vitamin (MULTIVITAMIN WITH MINERALS) TABS tablet, Take 1 tablet by mouth daily., Disp: , Rfl:    Omega-3 Fatty Acids (FISH OIL) 1000 MG CAPS, , Disp: , Rfl:    Turmeric 400 MG CAPS, Take 1 capsule by mouth daily., Disp: , Rfl:    VITAMIN D PO, , Disp: , Rfl:    zinc gluconate 50 MG tablet, Take 50 mg by mouth daily., Disp: , Rfl:    amLODipine (NORVASC) 2.5 MG tablet, Take 1 tablet (2.5 mg total) by mouth daily., Disp: 90 tablet, Rfl: 1   No Known Allergies   Review of Systems  Constitutional: Negative.   HENT: Negative.    Eyes: Negative.   Respiratory: Negative.    Cardiovascular: Negative.   Gastrointestinal: Negative.   Neurological: Negative.   Psychiatric/Behavioral: Negative.       Today's Vitals   03/06/23 0854  BP: 130/82  Pulse: 84  Temp: 98.3 F (36.8 C)  SpO2: 98%   Weight: 189 lb 9.6 oz (86 kg)  Height: 5\' 6"  (1.676 m)   Body mass index is 30.6 kg/m.  Wt Readings from Last 3 Encounters:  03/06/23 189 lb 9.6 oz (86 kg)  11/20/22 183 lb 9.6 oz (83.3 kg)  10/09/22 188 lb (85.3 kg)     Objective:  Physical Exam Vitals reviewed.  Constitutional:      General: She is not in acute distress.    Appearance: Normal appearance. She is obese.  Cardiovascular:     Rate and Rhythm: Normal rate and regular rhythm.     Pulses: Normal pulses.     Heart sounds: Normal heart sounds. No murmur heard. Pulmonary:     Effort: Pulmonary effort is normal. No respiratory distress.     Breath sounds: Normal breath sounds. No wheezing.  Skin:    General: Skin is warm and dry.     Capillary Refill: Capillary refill takes less than 2 seconds.  Neurological:     General: No focal deficit present.     Mental Status: She is alert and oriented to person, place, and time.     Cranial Nerves: No cranial nerve deficit.     Motor: No weakness.  Psychiatric:  Mood and Affect: Mood normal.        Behavior: Behavior normal.        Thought Content: Thought content normal.        Judgment: Judgment normal.        Assessment And Plan:  Essential hypertension Assessment & Plan: Continue amlodipine and focusing on low salt diet. Has recently been a little off track but plans to get back on track. Encouraged to drink at least 64 oz water per day  Orders: -     Basic metabolic panel -     amLODIPine Besylate; Take 1 tablet (2.5 mg total) by mouth daily.  Dispense: 90 tablet; Refill: 1  Mixed hyperlipidemia Assessment & Plan: Cholesterol levels remain elevated, continue low fat diet and increase fiber.   Orders: -     Lipid panel  Atypical chest pain Assessment & Plan: Advised if has worsening sharp pain to left lateral chest to be seen. Can also take an antiinflammatory with pain this may be related to nerve pain.    Class 1 obesity due to excess calories  with serious comorbidity and body mass index (BMI) of 30.0 to 30.9 in adult Assessment & Plan: Continue focusing on regular exercise. She is encouraged to strive for BMI less than 30 to decrease cardiac risk. Advised to aim for at least 150 minutes of exercise per week.      Return if symptoms worsen or fail to improve.   Patient was given opportunity to ask questions. Patient verbalized understanding of the plan and was able to repeat key elements of the plan. All questions were answered to their satisfaction.    Jeanell Sparrow, FNP, have reviewed all documentation for this visit. The documentation on 03/06/23 for the exam, diagnosis, procedures, and orders are all accurate and complete.   IF YOU HAVE BEEN REFERRED TO A SPECIALIST, IT MAY TAKE 1-2 WEEKS TO SCHEDULE/PROCESS THE REFERRAL. IF YOU HAVE NOT HEARD FROM US/SPECIALIST IN TWO WEEKS, PLEASE GIVE [place] A CALL AT [phone].

## 2023-03-06 NOTE — Assessment & Plan Note (Signed)
Advised if has worsening sharp pain to left lateral chest to be seen. Can also take an antiinflammatory with pain this may be related to nerve pain.

## 2023-03-06 NOTE — Assessment & Plan Note (Signed)
Cholesterol levels remain elevated, continue low fat diet and increase fiber.

## 2023-03-07 LAB — LIPID PANEL
Chol/HDL Ratio: 3 ratio (ref 0.0–4.4)
Cholesterol, Total: 213 mg/dL — ABNORMAL HIGH (ref 100–199)
HDL: 71 mg/dL (ref >39)
LDL Chol Calc (NIH): 125 mg/dL — ABNORMAL HIGH (ref 0–99)
Triglycerides: 95 mg/dL (ref 0–149)
VLDL Cholesterol Cal: 17 mg/dL (ref 5–40)

## 2023-03-07 LAB — BASIC METABOLIC PANEL
BUN/Creatinine Ratio: 18 (ref 9–23)
BUN: 16 mg/dL (ref 6–24)
CO2: 27 mmol/L (ref 20–29)
Calcium: 9.7 mg/dL (ref 8.7–10.2)
Chloride: 103 mmol/L (ref 96–106)
Creatinine, Ser: 0.88 mg/dL (ref 0.57–1.00)
Glucose: 85 mg/dL (ref 70–99)
Potassium: 4.1 mmol/L (ref 3.5–5.2)
Sodium: 143 mmol/L (ref 134–144)
eGFR: 76 mL/min/{1.73_m2} (ref >59)

## 2023-03-09 ENCOUNTER — Encounter: Payer: Self-pay | Admitting: Nurse Practitioner

## 2023-04-06 ENCOUNTER — Other Ambulatory Visit: Payer: Self-pay | Admitting: Nurse Practitioner

## 2023-04-06 ENCOUNTER — Encounter: Payer: Self-pay | Admitting: Nurse Practitioner

## 2023-04-06 DIAGNOSIS — R928 Other abnormal and inconclusive findings on diagnostic imaging of breast: Secondary | ICD-10-CM

## 2023-04-06 NOTE — Progress Notes (Signed)
Changed mammogram diagnostic

## 2023-04-06 NOTE — Addendum Note (Signed)
Addended by: [name] F on: 04/06/2023 03:09 PM   Modules accepted: Orders

## 2023-04-27 ENCOUNTER — Telehealth: Payer: Self-pay | Admitting: Cardiology

## 2023-04-27 NOTE — Progress Notes (Unsigned)
Cardiology Office Note:   Date:  04/28/2023  ID:  Katrina Tucker, DOB [birth year], MRN [id number] PCP:  [name], FNP  [facility] Providers Cardiologist:  Alverda Skeans, MD Referring MD: [name], FNP  Chief Complaint/Reason for Referral:  DOD visit chest pain, fatigue, and lightheadedness ASSESSMENT:    1. Precordial pain   2. Primary hypertension   3. Pure hypercholesterolemia   4. CKD (chronic kidney disease) stage 2, GFR 60-89 ml/min     PLAN:   In order of problems listed above: Chest pain:  We will obtain a coronary CTA and echocardiogram to evaluate further.  If the patient has mild obstructive coronary artery disease, they will require a statin (with goal LDL < 70) and aspirin, if they have high-grade disease we will need to consider optimal medical therapy and if symptoms are refractory to medical therapy, then a cardiac catheterization with possible PCI will be pursued to alleviate symptoms.  If they have high risk disease we will proceed directly to cardiac catheterization.   Hypertension: Increase amlodipine to 5 mg. Hyperlipidemia: Lipid panel recently was above goal.  Need for atorvastatin will be gauged by CT scan results. CKD stage II: I did talk with the patient about converting to an ARB given known benefits in terms of renal protection.  The patient is not interested in this maneuver today.           Dispo:  Return in about 3 months (around 07/29/2023).      Medication Adjustments/Labs and Tests Ordered: Current medicines are reviewed at length with the patient today.  Concerns regarding medicines are outlined above.  The following changes have been made:     Labs/tests ordered: Orders Placed This Encounter  Procedures   CT CORONARY MORPH W/CTA COR W/SCORE W/CA W/CM &/OR WO/CM   Basic metabolic panel   EKG 12-Lead   ECHOCARDIOGRAM COMPLETE    Medication Changes: Meds ordered this encounter  Medications   amLODipine (NORVASC) 5 MG tablet     Sig: Take 1 tablet (5 mg total) by mouth daily.    Dispense:  180 tablet    Refill:  3   metoprolol tartrate (LOPRESSOR) 100 MG tablet    Sig: Take 2 hours prior to Cardiac CT    Dispense:  1 tablet    Refill:  0    Current medicines are reviewed at length with the patient today.  The patient does not have concerns regarding medicines.  History of Present Illness:      FOCUSED PROBLEM LIST:   Chest pain 2020 [employer] Chest CTA demonstrated no dissection or pulmonary embolism; no ischemic evaluation Hypertension BMI 29 Pulmonary nodules Previously followed by pulmonology CKD stage II  October 2024: The patient is seen for expedited visit due to chest pain.  The patient previously been seen in our clinic in May of this year.  At that point in time she was doing well without chest pain.  Her blood pressure was elevated.  The patient contacted our office yesterday due to fatigue, lightheadedness, and left-sided chest pain for the last week which comes and goes.  The patient describes an chest pain syndrome with some typical and atypical features.  It sometimes occurs at rest and with exertion.  It does not seem to come on when she exercises at times.  She describes it as a left-sided chest pain and it radiates to the center of her chest.  It is sharp and also associated with a pressure type  feeling at times.  She denies any other cardiovascular complaints.  Her breathing is stable.  She denies any significant or severe palpitations, paroxysmal nocturnal dyspnea, orthopnea.  She has been checking her blood pressures at home and her systolics are typically above 130 mmHg.        Current Medications: Current Meds  Medication Sig   amLODipine (NORVASC) 5 MG tablet Take 1 tablet (5 mg total) by mouth daily.   Ascorbic Acid (VITAMIN C PO) Take by mouth daily at 2 PM.   Magnesium 200 MG TABS Take 1 tablet (200 mg total) by mouth daily. Take with evening meal   metoprolol tartrate  (LOPRESSOR) 100 MG tablet Take 2 hours prior to Cardiac CT   Multiple Vitamin (MULTIVITAMIN WITH MINERALS) TABS tablet Take 1 tablet by mouth daily.   Omega-3 Fatty Acids (FISH OIL) 1000 MG CAPS    Turmeric 400 MG CAPS Take 1 capsule by mouth daily.   VITAMIN D PO    zinc gluconate 50 MG tablet Take 50 mg by mouth daily.   [DISCONTINUED] amLODipine (NORVASC) 2.5 MG tablet Take 1 tablet (2.5 mg total) by mouth daily.   [DISCONTINUED] chlorhexidine (PERIDEX) 0.12 % solution SMARTSIG:By Mouth   [DISCONTINUED] PREVIDENT 0.2 % SOLN Take by mouth.     Review of Systems:   Please see the history of present illness.    All other systems reviewed and are negative.     EKGs/Labs/Other Test Reviewed:   EKG: EKG performed May 2024 that I personally reviewed demonstrates sinus rhythm  EKG Interpretation Date/Time:  Tuesday April 28 2023 08:45:25 EDT Ventricular Rate:  65 PR Interval:  164 QRS Duration:  78 QT Interval:  440 QTC Calculation: 457 R Axis:   35  Text Interpretation: Normal sinus rhythm Normal ECG No previous ECGs available Confirmed by Alverda Skeans ([id number]) on 04/28/2023 8:52:02 AM         Risk Assessment/Calculations:          Physical Exam:   VS:  BP 135/85   Pulse 67   Ht 5' 5.5" (1.664 m)   Wt 187 lb 3.2 oz (84.9 kg)   LMP 07/26/2015 (Exact Date)   SpO2 96%   BMI 30.68 kg/m        Wt Readings from Last 3 Encounters:  04/28/23 187 lb 3.2 oz (84.9 kg)  03/06/23 189 lb 9.6 oz (86 kg)  11/20/22 183 lb 9.6 oz (83.3 kg)      GENERAL:  No apparent distress, AOx3 HEENT:  No carotid bruits, +2 carotid impulses, no scleral icterus CAR: RRR no murmurs, gallops, rubs, or thrills RES:  Clear to auscultation bilaterally ABD:  Soft, nontender, nondistended, positive bowel sounds x 4 VASC:  +2 radial pulses, +2 carotid pulses NEURO:  CN 2-12 grossly intact; motor and sensory grossly intact PSYCH:  No active depression or anxiety EXT:  No edema, ecchymosis, or  cyanosis  Signed, Orbie Pyo, MD  04/28/2023 9:44 AM    [facility] [address] [place], [place], [place]  [address] Phone: [phone]; Fax: [phone]   Note:  This document was prepared using Dragon voice recognition software and may include unintentional dictation errors.

## 2023-04-27 NOTE — Telephone Encounter (Signed)
Pt c/o of Chest Pain: STAT if CP now or developed within 24 hours  1. Are you having CP right now?  No   2. Are you experiencing any other symptoms (ex. SOB, nausea, vomiting, sweating)?  Fatigue, lightheadedness   3. How long have you been experiencing CP?  For the past week mainly on left side   4. Is your CP continuous or coming and going?  Coming and going + becoming more frequent   5. Have you taken Nitroglycerin?  No  ?

## 2023-04-27 NOTE — Telephone Encounter (Signed)
Spoke with patient and states she has been experiencing sharp pains off and on for a while. She states over the last week it has been coming more often. No SOB, no lower and upper extremities, headache, nausea or vomiting. Currently not experiencing chest pain but being that it happening more frequently she is concerned. Appointment scheduled with DOD

## 2023-04-28 ENCOUNTER — Encounter: Payer: Self-pay | Admitting: Internal Medicine

## 2023-04-28 ENCOUNTER — Ambulatory Visit: Payer: Managed Care, Other (non HMO) | Attending: Internal Medicine | Admitting: Internal Medicine

## 2023-04-28 ENCOUNTER — Encounter: Payer: Self-pay | Admitting: Nurse Practitioner

## 2023-04-28 VITALS — BP 135/85 | HR 67 | Ht 65.5 in | Wt 187.2 lb

## 2023-04-28 DIAGNOSIS — N182 Chronic kidney disease, stage 2 (mild): Secondary | ICD-10-CM

## 2023-04-28 DIAGNOSIS — R072 Precordial pain: Secondary | ICD-10-CM

## 2023-04-28 DIAGNOSIS — I10 Essential (primary) hypertension: Secondary | ICD-10-CM | POA: Diagnosis not present

## 2023-04-28 DIAGNOSIS — E7800 Pure hypercholesterolemia, unspecified: Secondary | ICD-10-CM

## 2023-04-28 MED ORDER — AMLODIPINE BESYLATE 5 MG PO TABS: 5.0000 mg | ORAL_TABLET | Freq: Every day | ORAL | 3 refills | Status: DC

## 2023-04-28 MED ORDER — METOPROLOL TARTRATE 100 MG PO TABS: ORAL_TABLET | ORAL | 0 refills | Status: DC

## 2023-04-28 NOTE — Patient Instructions (Signed)
Medication Instructions:  Your physician has recommended you make the following change in your medication:  INCREASE: amlodipine (Norvasc) to 5 mg by mouth once daily  *If you need a refill on your cardiac medications before your next appointment, please call your pharmacy*   Lab Work: BMP  If you have labs (blood work) drawn today and your tests are completely normal, you will receive your results only by: MyChart Message (if you have MyChart) OR A paper copy in the mail If you have any lab test that is abnormal or we need to change your treatment, we will call you to review the results.   Testing/Procedures: Your physician has requested that you have an echocardiogram. Echocardiography is a painless test that uses sound waves to create images of your heart. It provides your doctor with information about the size and shape of your heart and how well your heart's chambers and valves are working. This procedure takes approximately one hour. There are no restrictions for this procedure. Please do NOT wear cologne, perfume, aftershave, or lotions (deodorant is allowed). Please arrive 15 minutes prior to your appointment time.   Your physician has requested that you have cardiac CT. Cardiac computed tomography (CT) is a painless test that uses an x-ray machine to take clear, detailed pictures of your heart. For further information please visit [email or web address] Please follow instruction sheet as given.     Follow-Up: At [facility], you and your health needs are our priority.  As part of our continuing mission to provide you with exceptional heart care, we have created designated Provider Care Teams.  These Care Teams include your primary Cardiologist (physician) and Advanced Practice Providers (APPs -  Physician Assistants and Nurse Practitioners) who all work together to provide you with the care you need, when you need it.    Your next appointment:   2 month(s)  Provider:    Robin Searing, NP, Tereso Newcomer, PA-C, or Perlie Gold, PA-C        Other Instructions  Your cardiac CT will be scheduled at one of the below locations:   [facility] [address] [place], [place] [address] [phone]  OR  [facility] [address] [place], [place] [address] [phone]  OR   [facility] [address] [place], [place] [address] [phone]  If scheduled at [facility], please arrive at the [facility] (Entrance C2) of [facility] 30 minutes prior to test start time. You can use the FREE valet parking offered at entrance C (encouraged to control the heart rate for the test)  Proceed to the [facility] Radiology Department (first floor) to check-in and test prep.  All radiology patients and guests should use entrance C2 at [facility], accessed from [facility], even though the hospital's physical address listed is [address]    If scheduled at [facility] or [facility], please arrive 15 mins early for check-in and test prep.  There is spacious parking and easy access to the radiology department from the [facility] entrance. Please enter here and check-in with the desk attendant.   Please follow these instructions carefully (unless otherwise directed):  An IV will be required for this test and Nitroglycerin will be given.  Hold all erectile dysfunction medications at least 3 days (72 hrs) prior to test. (Ie viagra, cialis, sildenafil, tadalafil, etc)  On the Night Before the Test: Be sure to Drink plenty of water. Do not consume any caffeinated/decaffeinated beverages or chocolate 12 hours prior to your test. Do not take any antihistamines 12 hours prior to your test.   On the Day of the Test: Drink plenty of water until 1 hour  prior to the test. Do not eat any food 1 hour prior to test. You may take your regular medications prior to the test.  Take metoprolol (Lopressor) 100 mg by mouth two hours prior to test. If you take Furosemide/Hydrochlorothiazide/Spironolactone, please HOLD on the morning of the test. FEMALES- please wear underwire-free bra if available, avoid dresses & tight clothing        After the Test: Drink plenty of water. After receiving IV contrast, you may experience a mild flushed feeling. This is normal. On occasion, you may experience a mild rash up to 24 hours after the test. This is not dangerous. If this occurs, you can take Benadryl 25 mg and increase your fluid intake. If you experience trouble breathing, this can be serious. If it is severe call 911 IMMEDIATELY. If it is mild, please call our office. If you take any of these medications: Glipizide/Metformin, Avandament, Glucavance, please do not take 48 hours after completing test unless otherwise instructed.  We will call to schedule your test 2-4 weeks out understanding that some insurance companies will need an authorization prior to the service being performed.   For more information and frequently asked questions, please visit our website : [email or web address]  For non-scheduling related questions, please contact the cardiac imaging nurse navigator should you have any questions/concerns: Cardiac Imaging Nurse Navigators Direct Office Dial: [phone]   For scheduling needs, including cancellations and rescheduling, please call [place], [phone].

## 2023-04-29 ENCOUNTER — Encounter: Payer: Self-pay | Admitting: Internal Medicine

## 2023-04-29 ENCOUNTER — Other Ambulatory Visit: Payer: Self-pay | Admitting: Internal Medicine

## 2023-04-29 LAB — BASIC METABOLIC PANEL
BUN/Creatinine Ratio: 20 (ref 9–23)
BUN: 16 mg/dL (ref 6–24)
CO2: 24 mmol/L (ref 20–29)
Calcium: 9.3 mg/dL (ref 8.7–10.2)
Chloride: 104 mmol/L (ref 96–106)
Creatinine, Ser: 0.82 mg/dL (ref 0.57–1.00)
Glucose: 83 mg/dL (ref 70–99)
Potassium: 4.1 mmol/L (ref 3.5–5.2)
Sodium: 141 mmol/L (ref 134–144)
eGFR: 82 mL/min/{1.73_m2} (ref >59)

## 2023-04-30 ENCOUNTER — Other Ambulatory Visit: Payer: Self-pay | Admitting: Internal Medicine

## 2023-05-01 ENCOUNTER — Other Ambulatory Visit: Payer: Self-pay | Admitting: Internal Medicine

## 2023-05-08 NOTE — Telephone Encounter (Signed)
Refused refill for metoprolol 100 mg.  This is a 1x med to be taken 2 hours prior to Cardiac C only.

## 2023-05-11 ENCOUNTER — Encounter (HOSPITAL_COMMUNITY): Payer: Self-pay

## 2023-05-13 ENCOUNTER — Encounter: Payer: Self-pay | Admitting: Internal Medicine

## 2023-05-13 ENCOUNTER — Ambulatory Visit (HOSPITAL_COMMUNITY)
Admission: RE | Admit: 2023-05-13 | Discharge: 2023-05-13 | Disposition: A | Discharge status: Home / Self Care | Payer: Managed Care, Other (non HMO) | Source: Ambulatory Visit | Attending: Internal Medicine | Admitting: Internal Medicine

## 2023-05-13 ENCOUNTER — Ambulatory Visit (HOSPITAL_BASED_OUTPATIENT_CLINIC_OR_DEPARTMENT_OTHER): Payer: Managed Care, Other (non HMO)

## 2023-05-13 DIAGNOSIS — R072 Precordial pain: Secondary | ICD-10-CM | POA: Insufficient documentation

## 2023-05-13 DIAGNOSIS — I10 Essential (primary) hypertension: Secondary | ICD-10-CM

## 2023-05-13 DIAGNOSIS — N182 Chronic kidney disease, stage 2 (mild): Secondary | ICD-10-CM | POA: Diagnosis not present

## 2023-05-13 LAB — ECHOCARDIOGRAM COMPLETE
Area-P 1/2: 3.5 cm2
Calc EF: 47.8 %
S' Lateral: 3.2 cm
Single Plane A2C EF: 50.3 %
Single Plane A4C EF: 47.7 %

## 2023-05-13 MED ORDER — NITROGLYCERIN 0.4 MG SL SUBL
0.8000 mg | SUBLINGUAL_TABLET | Freq: Once | SUBLINGUAL | Status: AC
  Administered 2023-05-13: 0.8 mg via SUBLINGUAL

## 2023-05-13 MED ORDER — IOHEXOL 350 MG/ML SOLN
95.0000 mL | Freq: Once | INTRAVENOUS | Status: AC | PRN
  Administered 2023-05-13: 95 mL via INTRAVENOUS

## 2023-05-13 MED ORDER — NITROGLYCERIN 0.4 MG SL SUBL
SUBLINGUAL_TABLET | SUBLINGUAL | Status: AC
Start: 2023-05-13 — End: ?
  Filled 2023-05-13: qty 2

## 2023-05-13 NOTE — Telephone Encounter (Signed)
I spoke with the pt re; her CT and Echo and she verbalized understanding.

## 2023-05-20 ENCOUNTER — Other Ambulatory Visit: Payer: Self-pay | Admitting: Internal Medicine

## 2023-05-26 ENCOUNTER — Encounter: Payer: Self-pay | Admitting: Nurse Practitioner

## 2023-05-26 ENCOUNTER — Ambulatory Visit: Payer: Managed Care, Other (non HMO) | Admitting: Dietician

## 2023-05-26 ENCOUNTER — Ambulatory Visit (INDEPENDENT_AMBULATORY_CARE_PROVIDER_SITE_OTHER): Payer: Managed Care, Other (non HMO) | Admitting: Nurse Practitioner

## 2023-05-26 VITALS — BP 120/78 | HR 60 | Temp 98.1°F | Ht 65.0 in | Wt 190.0 lb

## 2023-05-26 DIAGNOSIS — Z0000 Encounter for general adult medical examination without abnormal findings: Secondary | ICD-10-CM | POA: Diagnosis not present

## 2023-05-26 DIAGNOSIS — E782 Mixed hyperlipidemia: Secondary | ICD-10-CM

## 2023-05-26 DIAGNOSIS — I10 Essential (primary) hypertension: Secondary | ICD-10-CM | POA: Diagnosis not present

## 2023-05-26 DIAGNOSIS — H6123 Impacted cerumen, bilateral: Secondary | ICD-10-CM | POA: Insufficient documentation

## 2023-05-26 DIAGNOSIS — Z6831 Body mass index (BMI) 31.0-31.9, adult: Secondary | ICD-10-CM

## 2023-05-26 DIAGNOSIS — Z1211 Encounter for screening for malignant neoplasm of colon: Secondary | ICD-10-CM

## 2023-05-26 DIAGNOSIS — Z79899 Other long term (current) drug therapy: Secondary | ICD-10-CM

## 2023-05-26 DIAGNOSIS — Z2821 Immunization not carried out because of patient refusal: Secondary | ICD-10-CM | POA: Insufficient documentation

## 2023-05-26 NOTE — Progress Notes (Signed)
I,Jameka J Llittleton, CMA,acting as a Neurosurgeon for [employer], FNP.,have documented all relevant documentation on the behalf of [name], FNP,as directed by  [name], FNP while in the presence of [name], FNP.  Subjective:    Patient ID: Katrina Tucker , female    DOB: 1966-01-03 , 57 y.o.   MRN: [id number]  Chief Complaint  Patient presents with   Annual Exam    HPI  Patient presents today for a physical. Patient reports compliance with her meds. Patient does not have any questions or concerns at this time.  Wt Readings from Last 3 Encounters: 05/26/23 : 190 lb (86.2 kg) 04/28/23 : 187 lb 3.2 oz (84.9 kg) 03/06/23 : 189 lb 9.6 oz (86 kg)  Her weight had increased to 198 lbs after going to Cardiology. She does not have any blockages. He feels her work for her heart is due to her blood pressure once better he feels will improve. She was treated recently for URI and pink eye. She took azithromycin and eye drop/ointment.   She is scheduled for her MRI of her breast next week on Tuesday at 11 am at [facility]       Past Medical History:  Diagnosis Date   Anemia    Headache    history of    Urinary tract infectious disease 08/02/2020   UTI (lower urinary tract infection)      History reviewed. No pertinent family history.   Current Outpatient Medications:    amLODipine (NORVASC) 5 MG tablet, Take 1 tablet (5 mg total) by mouth daily., Disp: 180 tablet, Rfl: 3   Ascorbic Acid (VITAMIN C PO), Take by mouth daily at 2 PM., Disp: , Rfl:    Cyanocobalamin (VITAMIN B-12 PO), Take 1 capsule by mouth daily at 2 am., Disp: , Rfl:    Magnesium 200 MG TABS, Take 1 tablet (200 mg total) by mouth daily. Take with evening meal, Disp: 30 tablet, Rfl: 1   Multiple Vitamin (MULTIVITAMIN WITH MINERALS) TABS tablet, Take 1 tablet by mouth daily., Disp: , Rfl:    Omega-3 Fatty Acids (FISH OIL) 1000 MG CAPS, , Disp: , Rfl:    Red Yeast Rice Extract (RED YEAST RICE PO), Take 1 tablet by mouth  daily at 2 am., Disp: , Rfl:    Turmeric 400 MG CAPS, Take 1 capsule by mouth daily., Disp: , Rfl:    VITAMIN D PO, , Disp: , Rfl:    zinc gluconate 50 MG tablet, Take 50 mg by mouth daily., Disp: , Rfl:    No Known Allergies    The patient states she uses status post hysterectomy for birth control. Patient's last menstrual period was 07/26/2015 (exact date).. Negative for Dysmenorrhea and Negative for Menorrhagia. Negative for: breast discharge, breast lump(s), breast pain and breast self exam. Associated symptoms include abnormal vaginal bleeding. Pertinent negatives include abnormal bleeding (hematology), anxiety, decreased libido, depression, difficulty falling sleep, dyspareunia, history of infertility, nocturia, sexual dysfunction, sleep disturbances, urinary incontinence, urinary urgency, vaginal discharge and vaginal itching. Diet regular; she is going to the nutritionist today after this appt.  The patient states her exercise level is moderate with 3 days a week.   [name] tobacco use is:  Social History   Tobacco Use  Smoking Status Never   Passive exposure: Yes  Smokeless Tobacco Never  Tobacco Comments   mother smoked in home growing up.   . She has been exposed to passive smoke. [name] alcohol use is:  Social History   Substance and Sexual Activity  Alcohol Use Yes   Alcohol/week: 7.0 - 14.0 standard drinks of alcohol   Types: 7 - 14 Glasses of wine per week    Review of Systems  Constitutional: Negative.   HENT: Negative.         She is in the process of getting implants to her teeth  Eyes: Negative.   Respiratory: Negative.    Cardiovascular: Negative.   Gastrointestinal: Negative.   Endocrine: Negative.   Genitourinary: Negative.   Musculoskeletal: Negative.   Skin: Negative.   Allergic/Immunologic: Negative.   Neurological: Negative.   Hematological: Negative.   Psychiatric/Behavioral: Negative.       Today's Vitals   05/26/23 1456  BP:  120/78  Pulse: 60  Temp: 98.1 F (36.7 C)  Weight: 190 lb (86.2 kg)  Height: 5\' 5"  (1.651 m)  PainSc: 0-No pain   Body mass index is 31.62 kg/m.  Wt Readings from Last 3 Encounters:  05/26/23 190 lb (86.2 kg)  04/28/23 187 lb 3.2 oz (84.9 kg)  03/06/23 189 lb 9.6 oz (86 kg)     Objective:  Physical Exam Constitutional:      General: She is not in acute distress.    Appearance: Normal appearance. She is well-developed. She is obese.  HENT:     Head: Normocephalic and atraumatic.     Right Ear: Hearing and external ear normal. There is impacted cerumen.     Left Ear: Hearing and external ear normal. There is impacted cerumen.     Nose: Nose normal.     Mouth/Throat:     Mouth: Mucous membranes are moist.  Eyes:     General: Lids are normal.     Extraocular Movements: Extraocular movements intact.     Conjunctiva/sclera: Conjunctivae normal.     Pupils: Pupils are equal, round, and reactive to light.     Funduscopic exam:    Right eye: No papilledema.        Left eye: No papilledema.  Neck:     Thyroid: No thyroid mass.     Vascular: No carotid bruit.  Cardiovascular:     Rate and Rhythm: Normal rate and regular rhythm.     Pulses: Normal pulses.     Heart sounds: Normal heart sounds. No murmur heard. Pulmonary:     Effort: Pulmonary effort is normal. No respiratory distress.     Breath sounds: Normal breath sounds. No wheezing.  Chest:     Chest wall: No mass.  Breasts:    Tanner Score is 5.     Right: Normal. No mass or tenderness.     Left: Normal. No mass or tenderness.  Abdominal:     General: Abdomen is flat. Bowel sounds are normal. There is no distension.     Palpations: Abdomen is soft.     Tenderness: There is no abdominal tenderness.  Genitourinary:    Comments: Followed by GYN Musculoskeletal:        General: No swelling. Normal range of motion.     Cervical back: Full passive range of motion without pain, normal range of motion and neck supple.      Right lower leg: No edema.     Left lower leg: No edema.  Lymphadenopathy:     Upper Body:     Right upper body: No supraclavicular, axillary or pectoral adenopathy.     Left upper body: No supraclavicular, axillary or pectoral adenopathy.  Skin:  General: Skin is warm and dry.     Capillary Refill: Capillary refill takes less than 2 seconds.  Neurological:     General: No focal deficit present.     Mental Status: She is alert and oriented to person, place, and time.     Cranial Nerves: No cranial nerve deficit.     Sensory: No sensory deficit.     Motor: No weakness.  Psychiatric:        Mood and Affect: Mood normal.        Behavior: Behavior normal.        Thought Content: Thought content normal.        Judgment: Judgment normal.         Assessment And Plan:     Annual physical exam Assessment & Plan: Behavior modifications discussed and diet history reviewed.   Pt will continue to exercise regularly and modify diet with low GI, plant based foods and decrease intake of processed foods.  Recommend intake of daily multivitamin, Vitamin D, and calcium.  Recommend breast MRI due to family history of breast cancer (will have done at [facility] next week) and colonoscopy for preventive screenings, as well as recommend immunizations that include influenza, TDAP, and Shingles    BMI 31.0-31.9,adult Assessment & Plan: She is encouraged to strive for BMI less than 30 to decrease cardiac risk. Advised to aim for at least 150 minutes of exercise per week. She reports doing well and losing approximately 8 lbs since her last visit she had gained some weight   Influenza vaccination declined Assessment & Plan: Patient declined influenza vaccination at this time. Patient is aware that influenza vaccine prevents illness in 70% of healthy people, and reduces hospitalizations to 30-70% in elderly. This vaccine is recommended annually. Education has been provided regarding the importance of this  vaccine but patient still declined. Advised may receive this vaccine at local pharmacy or Health Dept.or vaccine clinic. Aware to provide a copy of the vaccination record if obtained from local pharmacy or Health Dept.  Pt is willing to accept risk associated with refusing vaccination.    COVID-19 vaccination declined Assessment & Plan: Declines covid 19 vaccine. Discussed risk of covid 30 and if she changes her mind about the vaccine to call the office. Education has been provided regarding the importance of this vaccine but patient still declined. Advised may receive this vaccine at local pharmacy or Health Dept.or vaccine clinic. Aware to provide a copy of the vaccination record if obtained from local pharmacy or Health Dept.  Encouraged to take multivitamin, vitamin d, vitamin c and zinc to increase immune system. Aware can call office if would like to have vaccine here at office. Verbalized acceptance and understanding.    Encounter for screening colonoscopy Assessment & Plan: According to USPTF Colorectal cancer Screening guidelines. Colonoscopy is recommended every 10 years, starting at age 34 years. Will refer to GI (Dr. Kenna Gilbert office) for colon cancer screening   Orders: -     Ambulatory referral to Gastroenterology  Essential hypertension Assessment & Plan: Continue amlodipine and focusing on low salt diet. Has had improved blood pressure to normal. Continue with her water intake approximately 90 oz a day  Orders: -     Microalbumin / creatinine urine ratio  Mixed hyperlipidemia Assessment & Plan: Cholesterol levels remain elevated, continue low fat diet and increase fiber.   Orders: -     Lipid panel  Bilateral impacted cerumen Assessment & Plan: Wax is removed by with  lavage with elephant ear with 1/2 water and 1/2 peroxide. Instructions for home care to prevent wax buildup are given.   Orders: -     Ear Lavage  Other long term (current) drug therapy -     CBC -      TSH    Return for 1 year physical; 6 month f/u. Patient was given opportunity to ask questions. Patient verbalized understanding of the plan and was able to repeat key elements of the plan. All questions were answered to their satisfaction.   [name], FNP  I, [name], FNP, have reviewed all documentation for this visit. The documentation on 05/26/23 for the exam, diagnosis, procedures, and orders are all accurate and complete.

## 2023-05-26 NOTE — Assessment & Plan Note (Signed)
Behavior modifications discussed and diet history reviewed.   Pt will continue to exercise regularly and modify diet with low GI, plant based foods and decrease intake of processed foods.  Recommend intake of daily multivitamin, Vitamin D, and calcium.  Recommend breast MRI due to family history of breast cancer (will have done at [facility] next week) and colonoscopy for preventive screenings, as well as recommend immunizations that include influenza, TDAP, and Shingles

## 2023-05-26 NOTE — Assessment & Plan Note (Signed)
According to USPTF Colorectal cancer Screening guidelines. Colonoscopy is recommended every 10 years, starting at age 59 years. Will refer to GI (Dr. Kenna Gilbert office) for colon cancer screening

## 2023-05-26 NOTE — Assessment & Plan Note (Signed)
Continue amlodipine and focusing on low salt diet. Has had improved blood pressure to normal. Continue with her water intake approximately 90 oz a day

## 2023-05-26 NOTE — Assessment & Plan Note (Signed)
She is encouraged to strive for BMI less than 30 to decrease cardiac risk. Advised to aim for at least 150 minutes of exercise per week. She reports doing well and losing approximately 8 lbs since her last visit she had gained some weight

## 2023-05-26 NOTE — Assessment & Plan Note (Signed)

## 2023-05-26 NOTE — Assessment & Plan Note (Signed)
Cholesterol levels remain elevated, continue low fat diet and increase fiber.

## 2023-05-26 NOTE — Assessment & Plan Note (Signed)
 Wax is removed by with lavage with elephant ear with 1/2 water and 1/2 peroxide. Instructions for home care to prevent wax buildup are given.

## 2023-05-26 NOTE — Assessment & Plan Note (Signed)

## 2023-05-27 LAB — LIPID PANEL
Chol/HDL Ratio: 3.5 ratio (ref 0.0–4.4)
Cholesterol, Total: 229 mg/dL — ABNORMAL HIGH (ref 100–199)
HDL: 65 mg/dL (ref >39)
LDL Chol Calc (NIH): 153 mg/dL — ABNORMAL HIGH (ref 0–99)
Triglycerides: 65 mg/dL (ref 0–149)
VLDL Cholesterol Cal: 11 mg/dL (ref 5–40)

## 2023-05-27 LAB — CBC
Hematocrit: 39.5 % (ref 34.0–46.6)
Hemoglobin: 13.3 g/dL (ref 11.1–15.9)
MCH: 30.2 pg (ref 26.6–33.0)
MCHC: 33.7 g/dL (ref 31.5–35.7)
MCV: 90 fL (ref 79–97)
Platelets: 291 10*3/uL (ref 150–450)
RBC: 4.4 x10E6/uL (ref 3.77–5.28)
RDW: 11.9 % (ref 11.7–15.4)
WBC: 8.1 10*3/uL (ref 3.4–10.8)

## 2023-05-27 LAB — MICROALBUMIN / CREATININE URINE RATIO
Creatinine, Urine: 23.8 mg/dL
Microalb/Creat Ratio: <13 mg/g{creat} (ref 0–29)
Microalbumin, Urine: <3 ug/mL

## 2023-05-27 LAB — TSH: TSH: 2.26 u[IU]/mL (ref 0.450–4.500)

## 2023-06-23 ENCOUNTER — Encounter: Payer: Self-pay | Admitting: Physician Assistant

## 2023-06-23 ENCOUNTER — Ambulatory Visit: Payer: Managed Care, Other (non HMO) | Attending: Physician Assistant | Admitting: Physician Assistant

## 2023-06-23 VITALS — BP 130/90 | HR 66 | Ht 65.5 in | Wt 191.4 lb

## 2023-06-23 DIAGNOSIS — I779 Disorder of arteries and arterioles, unspecified: Secondary | ICD-10-CM

## 2023-06-23 DIAGNOSIS — I428 Other cardiomyopathies: Secondary | ICD-10-CM | POA: Diagnosis not present

## 2023-06-23 DIAGNOSIS — I10 Essential (primary) hypertension: Secondary | ICD-10-CM

## 2023-06-23 DIAGNOSIS — I5020 Unspecified systolic (congestive) heart failure: Secondary | ICD-10-CM

## 2023-06-23 HISTORY — DX: Unspecified systolic (congestive) heart failure: I50.20

## 2023-06-23 NOTE — Assessment & Plan Note (Signed)
Ascending aorta 38 mm (mildly dilated) on echocardiogram in October 2024.  This can be monitored with annual echocardiograms.  As noted, she will have another limited echo in 3 months to recheck EF.

## 2023-06-23 NOTE — Patient Instructions (Addendum)
Medication Instructions:  Your physician recommends that you continue on your current medications as directed. Please refer to the Current Medication list given to you today.  *If you need a refill on your cardiac medications before your next appointment, please call your pharmacy*   Lab Work: None ordered  If you have labs (blood work) drawn today and your tests are completely normal, you will receive your results only by: MyChart Message (if you have MyChart) OR A paper copy in the mail If you have any lab test that is abnormal or we need to change your treatment, we will call you to review the results.   Testing/Procedures: Your physician has requested that you have an Limited echocardiogram in 3 MONTHS. Echocardiography is a painless test that uses sound waves to create images of your heart. It provides your doctor with information about the size and shape of your heart and how well your heart's chambers and valves are working. This procedure takes approximately one hour. There are no restrictions for this procedure. Please do NOT wear cologne, perfume, aftershave, or lotions (deodorant is allowed). Please arrive 15 minutes prior to your appointment time.  Please note: We ask at that you not bring children with you during ultrasound (echo/ vascular) testing. Due to room size and safety concerns, children are not allowed in the ultrasound rooms during exams. Our front office staff cannot provide observation of children in our lobby area while testing is being conducted. An adult accompanying a patient to their appointment will only be allowed in the ultrasound room at the discretion of the ultrasound technician under special circumstances. We apologize for any inconvenience.    Follow-Up: At [facility], you and your health needs are our priority.  As part of our continuing mission to provide you with exceptional heart care, we have created designated Provider Care Teams.  These  Care Teams include your primary Cardiologist (physician) and Advanced Practice Providers (APPs -  Physician Assistants and Nurse Practitioners) who all work together to provide you with the care you need, when you need it.  We recommend signing up for the patient portal called "MyChart".  Sign up information is provided on this After Visit Summary.  MyChart is used to connect with patients for Virtual Visits (Telemedicine).  Patients are able to view lab/test results, encounter notes, upcoming appointments, etc.  Non-urgent messages can be sent to your provider as well.   To learn more about what you can do with MyChart, go to [email or web address].    Your next appointment:   3 month(s) (1 WEEK AFTER ECHO)  Provider:   Orbie Pyo, MD  or Tereso Newcomer, PA-C         Other Instructions  Your physician has requested that you regularly monitor and record your blood pressure readings at home. Please use the same machine at the same time of day to check your readings and record them to bring to your follow-up visit.   Please monitor blood pressures and keep a log of your readings for 2 weeks then send in via mychart   Make sure to check 2 hours after your medications.    AVOID these things for 30 minutes before checking your blood pressure: No Drinking caffeine. No Drinking alcohol. No Eating. No Smoking. No Exercising.   Five minutes before checking your blood pressure: Pee. Sit in a dining chair. Avoid sitting in a soft couch or armchair. Be quiet. Do not talk

## 2023-06-23 NOTE — Progress Notes (Signed)
Cardiology Office Note:    Date:  06/23/2023  ID:  Katrina Tucker, DOB [birth year], MRN [id number] PCP: [name], FNP  [facility] Providers Cardiologist:  Orbie Pyo, MD       Patient Profile:      HFmrEF (heart failure with mildly reduced ejection fraction)  Non-ischemic cardiomyopathy  CCTA 05/13/23: CAC score 0, no CAD, ascending aorta 3.4 cm   TTE 05/13/23: EF 45-50, no RWMA, mild LVH, Gr 1 DD, GLS -15.9, mildly reduced RVSF, NL PASP, RVSP 21.7, trivial MR, trivial AI, ascending aorta 38 mm, RAP 8 Mildly dilated aorta  Chronic kidney disease  Pulmonary nodules Followed by pulmonology          History of Present Illness:  Discussed the use of AI scribe software for clinical note transcription with the patient, who gave verbal consent to proceed.  Katrina Tucker is a 57 y.o. female who returns for follow up of non-ischemic cardiomyopathy, hypertension. She was evaluated by Dr. Lynnette Caffey in 04/2023 for chest pain. CCTA showed no CAD. TTE showed EF 45-50.  She is here alone. We discussed the results of her CCTA and Echocardiogram. [name] BP had been persistently high in the past. Her home monitor has been unreliable. The patient maintains an active lifestyle, including regular exercise on a Peloton and weightlifting. She reports no shortness of breath, even when jogging up steps or during exercise. The patient also mentioned a recent change in diet, incorporating more plant-based, high fiber, and low salt foods. The patient had dental surgery earlier today which has caused significant pain      ROS   See HPI     Studies Reviewed:                 Risk Assessment/Calculations:     HYPERTENSION CONTROL Vitals:   06/23/23 1414 06/23/23 1512  BP: 108/62 (!) 130/90    [name] blood pressure is elevated above target today.  In order to address the patient's elevated BP: Blood pressure will be monitored at home to determine if medication changes need  to be made.          Physical Exam:   VS:  BP (!) 130/90   Pulse 66   Ht 5' 5.5" (1.664 m)   Wt 191 lb 6.4 oz (86.8 kg)   LMP 07/26/2015 (Exact Date)   SpO2 93%   BMI 31.37 kg/m    Wt Readings from Last 3 Encounters:  06/23/23 191 lb 6.4 oz (86.8 kg)  05/26/23 190 lb (86.2 kg)  04/28/23 187 lb 3.2 oz (84.9 kg)    Constitutional:      Appearance: Healthy appearance. Not in distress.  Neck:     Vascular: JVD normal.  Pulmonary:     Breath sounds: Normal breath sounds. No wheezing. No rales.  Cardiovascular:     Normal rate. Regular rhythm.     Murmurs: There is no murmur.  Edema:    Peripheral edema absent.  Abdominal:     Palpations: Abdomen is soft.        Assessment and Plan:   Assessment & Plan Heart failure with mildly reduced ejection fraction (HFmrEF, 41-49%) (HCC) Mildly reduced ejection fraction (45-50%). Non ischemic cardiomyopathy as CT scan showed no CAD. Overall, likely secondary to uncontrolled hypertension. She is asymptomatic - AHA Stabe B, NYHA I. Discussed importance of controlling blood pressure to manage cardiomyopathy. Explained benefits of ACE inhibitors/ARBs and beta blockers. We discussed switching from amlodipine to  valsartan +/- beta blocker vs continued management of BP with Amlodipine and repeating limited echo in 3 mos to recheck EF. She prefers to remain on Amlodipine and repeat an echo in 3 mos.  - Repeat Limited Echo in 3 mos - F/u 3 mos (after echo) - If EF remains low, start ACE/ARB +/- beta blocker Essential hypertension BP elevated today. she is in a lot of pain due to dental work earlier today. - Monitor blood pressure at home and report readings via MyChart in two weeks - Consider adding Valsartan if BP remains > 130/80.  Aortic disease (HCC) Ascending aorta 38 mm (mildly dilated) on echocardiogram in October 2024.  This can be monitored with annual echocardiograms.  As noted, she will have another limited echo in 3 months to recheck  EF.      Dispo:  Return in about 3 months (around 09/21/2023) for Routine Follow Up, w/ Dr. Lynnette Caffey, or Tereso Newcomer, PA-C.  Signed, Tereso Newcomer, PA-C

## 2023-06-23 NOTE — Assessment & Plan Note (Signed)
Mildly reduced ejection fraction (45-50%). Non ischemic cardiomyopathy as CT scan showed no CAD. Overall, likely secondary to uncontrolled hypertension. She is asymptomatic - AHA Stabe B, NYHA I. Discussed importance of controlling blood pressure to manage cardiomyopathy. Explained benefits of ACE inhibitors/ARBs and beta blockers. We discussed switching from amlodipine to valsartan +/- beta blocker vs continued management of BP with Amlodipine and repeating limited echo in 3 mos to recheck EF. She prefers to remain on Amlodipine and repeat an echo in 3 mos.  - Repeat Limited Echo in 3 mos - F/u 3 mos (after echo) - If EF remains low, start ACE/ARB +/- beta blocker

## 2023-06-23 NOTE — Assessment & Plan Note (Signed)
BP elevated today. she is in a lot of pain due to dental work earlier today. - Monitor blood pressure at home and report readings via MyChart in two weeks - Consider adding Valsartan if BP remains > 130/80.

## 2023-07-30 ENCOUNTER — Encounter: Payer: Managed Care, Other (non HMO) | Attending: Nurse Practitioner | Admitting: Dietician

## 2023-07-30 ENCOUNTER — Encounter: Payer: Self-pay | Admitting: Dietician

## 2023-07-30 DIAGNOSIS — E782 Mixed hyperlipidemia: Secondary | ICD-10-CM | POA: Insufficient documentation

## 2023-07-30 NOTE — Patient Instructions (Signed)
 Goals Established by Pt  Goal: go to bed with enough time to get 8 hours of sleep at least 5 days per week.   Goal: eat a meal that follows the 'Plate Method' 2 times per day at least 3 days per week.

## 2023-07-30 NOTE — Progress Notes (Signed)
 Medical Nutrition Therapy  Appointment Start time:  [id number]  Appointment End time:  1500  Primary concerns today: pt wants to understand how to eat in order to best benefit her health.    Referral diagnosis: mixed hyperlipidemia Preferred learning style: no preference indicated Learning readiness: ready   NUTRITION ASSESSMENT   Anthropometrics   Pt declined weight.   Clinical Medical Hx: anemia, HLD, HTN Medications: reviewed Labs: 05/26/23: cholesterol 229, LDL 153 Notable Signs/Symptoms: none reported Food Allergies: none  Lifestyle & Dietary Hx  Pt states she wants recipe ideas and to better understand how to eat to improve her health. Pt reports she has high blood pressure and high cholesterol.   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 Pt states this was not sustainable for her body and she did not feel healthy. Pt states the weight she felt healthiest at was in the 170 pound area.   Pt states she started noticing her habits changing and feeling less healthy when she started working from home in around 2021, and also started traveling for work (flights, airports, hotels), and eating out more frequently. She states she also got married 1 year ago and states her husband likes to eat larger meals (pancakes, bacon) which she was not used to in the past. She states they also go out to eat each weekend for lunch ([employer]) and dinner (date night).  Pt states she wakes up 6:30am most days but husband gets up at 4:30am which sometimes wakes her up. Pt states she feels her sleep could improve and reports they often fall asleep on the couch which interrupts her sleep.    Estimated daily fluid intake: 86 oz water  Supplements: vitamin b12, elderberry, magnesium , red yeast rice, turmeric, zinc, MVI, omega 3, vitamin C, vitamin D Sleep: pt states sleep has changed, 6 hours.  Stress / self-care: low stress Current  average weekly physical activity: exercises 3-4 times per week, 15 min HIIT on pelaton  24-Hr Dietary Recall First Meal: none OR raisin bran and almond milk Snack: none Second Meal: leftovers from dinner (shrimp caesar salad OR half PBJ OR piece of cheese toast) Snack: cashews or pistachios Third Meal: baked chicken, rice, gravy, spinach OR fried chicken, coleslaw OR fried fish and french fries and broccoli OR salmon and spinach Snack: oreo cupcake OR klondike bar Beverages: 6 bottles water , 2 glasses simply lemonade, 2 margarita on weekend   NUTRITION DIAGNOSIS  NB-1.1 Food and nutrition-related knowledge deficit As related to lack of prior nutrition education by a tourist information centre manager.  As evidenced by pt report.   NUTRITION INTERVENTION  Nutrition education (E-1) on the following topics:   Plate Method Fruits & Vegetables: Aim to fill half your plate with a variety of fruits and vegetables. They are rich in vitamins, minerals, and fiber, and can help reduce the risk of chronic diseases. Choose a colorful assortment of fruits and vegetables to ensure you get a wide range of nutrients. Grains and Starches: Make at least half of your grain choices whole grains, such as brown rice, whole wheat bread, and oats. Whole grains provide fiber, which aids in digestion and healthy cholesterol levels. Aim for whole forms of starchy vegetables such as potatoes, sweet potatoes, beans, peas, and corn, which are fiber rich and provide many vitamins and minerals.  Protein: Incorporate lean sources of protein, such as poultry, fish, beans, nuts, and seeds, into your meals. Protein  is essential for building and repairing tissues, staying full, balancing blood sugar, as well as supporting immune function. Dairy: Include low-fat or fat-free dairy products like milk, yogurt, and cheese in your diet. Dairy foods are excellent sources of calcium and vitamin D, which are crucial for bone health.   LDL Lowering  Nutrition Therapy Soluble fiber and impact on lowering cholesterol: Sources: Oats, barley, beans, lentils, fruits (apples, oranges), vegetables (carrots, broccoli), and flaxseeds. Benefits: Soluble fiber reduces the absorption of cholesterol into your bloodstream. Choose Healthy Unsaturated Fats and Omega 3's. Sources: Olive oil, canola oil, avocados, nuts, and Fatty fish (salmon, trout), walnuts, flaxseeds, and chia seeds. Benefits: Helps raise HDL cholesterol and lower LDL cholesterol. Omega-3s help reduce triglycerides and raise HDL cholesterol. Limit Saturated Fats: Sources: Red meat, full-fat dairy products, butter, and coconut oil. Benefits: Reducing intake helps lower LDL cholesterol levels. Eat Plenty of Fruits and Vegetables. Benefits: High in fiber and antioxidants, which help lower LDL cholesterol. Eat Whole Grains. Sources: Brown rice, whole wheat bread, quinoa, and whole grain pasta. Benefits: Whole grains contain more fiber and nutrients than refined grains. Regular Physical Activity: Aim for at least 30 minutes of moderate-intensity exercise most days of the week.  Sleep & Nutrition Impact of sleep on nutrition: Inadequate sleep can have significant impacts on nutrition by increasing appetite and cravings for high-calorie foods, altering food choices, and disrupting metabolic processes. This can lead to weight gain, poor glucose management, and impaired nutrient absorption. Additionally, sleep deprivation often results in reduced physical activity, further affecting overall health. Tips for better sleep: To improve sleep quality, maintain a consistent sleep schedule and create a relaxing bedtime routine. Optimize your sleep environment by keeping it cool, dark, and quiet, and limit screen time before bed. Be mindful of food and drink intake, engage in regular physical activity, and manage stress effectively. By prioritizing better sleep habits, you can positively influence your nutrition and  overall well-being.  Handouts Provided Include  Plate Method  Learning Style & Readiness for Change Teaching method utilized: Visual & Auditory  Demonstrated degree of understanding via: Teach Back  Barriers to learning/adherence to lifestyle change: none  Goals Established by Pt  Goal: go to bed with enough time to get 8 hours of sleep at least 5 days per week.   Goal: eat a meal that follows the 'Plate Method' 2 times per day at least 3 days per week.    MONITORING & EVALUATION Dietary intake, weekly physical activity, and follow up in 2 months.  Next Steps  Patient is to call for questions.

## 2023-09-08 ENCOUNTER — Encounter: Payer: Self-pay | Admitting: Nurse Practitioner

## 2023-09-09 ENCOUNTER — Other Ambulatory Visit: Payer: Self-pay | Admitting: Nurse Practitioner

## 2023-09-09 DIAGNOSIS — I779 Disorder of arteries and arterioles, unspecified: Secondary | ICD-10-CM

## 2023-09-09 DIAGNOSIS — I10 Essential (primary) hypertension: Secondary | ICD-10-CM

## 2023-09-09 DIAGNOSIS — I5020 Unspecified systolic (congestive) heart failure: Secondary | ICD-10-CM

## 2023-09-28 ENCOUNTER — Telehealth (HOSPITAL_COMMUNITY): Payer: Self-pay | Admitting: Physician Assistant

## 2023-09-28 NOTE — Telephone Encounter (Signed)
 Patient called and cancelled echocardiogram for 10/02/23 and did not wish to reschedule. Order will be removed from the echo WQ. Thank you.

## 2023-10-02 ENCOUNTER — Ambulatory Visit (HOSPITAL_COMMUNITY): Payer: Managed Care, Other (non HMO)

## 2023-10-02 ENCOUNTER — Ambulatory Visit: Payer: Managed Care, Other (non HMO) | Admitting: Dietician

## 2023-10-09 ENCOUNTER — Ambulatory Visit: Payer: Managed Care, Other (non HMO) | Admitting: Physician Assistant

## 2023-10-09 ENCOUNTER — Ambulatory Visit: Payer: Managed Care, Other (non HMO) | Admitting: Dietician

## 2023-10-12 LAB — LAB REPORT - SCANNED: EGFR: 85

## 2023-10-17 ENCOUNTER — Encounter: Payer: Self-pay | Admitting: Nurse Practitioner

## 2023-11-23 ENCOUNTER — Ambulatory Visit: Payer: Managed Care, Other (non HMO) | Admitting: Nurse Practitioner

## 2023-11-26 ENCOUNTER — Ambulatory Visit: Admitting: Nurse Practitioner

## 2023-11-26 VITALS — BP 122/80 | HR 91 | Temp 98.4°F | Ht 65.0 in | Wt 196.0 lb

## 2023-11-26 DIAGNOSIS — E6609 Other obesity due to excess calories: Secondary | ICD-10-CM | POA: Diagnosis not present

## 2023-11-26 DIAGNOSIS — I10 Essential (primary) hypertension: Secondary | ICD-10-CM | POA: Diagnosis not present

## 2023-11-26 DIAGNOSIS — E66811 Obesity, class 1: Secondary | ICD-10-CM

## 2023-11-26 DIAGNOSIS — E782 Mixed hyperlipidemia: Secondary | ICD-10-CM | POA: Diagnosis not present

## 2023-11-26 DIAGNOSIS — Z6832 Body mass index (BMI) 32.0-32.9, adult: Secondary | ICD-10-CM

## 2023-11-26 DIAGNOSIS — Z2821 Immunization not carried out because of patient refusal: Secondary | ICD-10-CM

## 2023-11-26 MED ORDER — ZEPBOUND 2.5 MG/0.5ML ~~LOC~~ SOAJ: 2.5000 mg | SUBCUTANEOUS | 0 refills | Status: DC

## 2023-11-26 MED ORDER — ZEPBOUND 5 MG/0.5ML ~~LOC~~ SOAJ: 5.0000 mg | SUBCUTANEOUS | 1 refills | Status: DC

## 2023-11-26 NOTE — Progress Notes (Signed)
 [place], CMA,acting as a Neurosurgeon for Katrina Epley, FNP.,have documented all relevant documentation on the behalf of Katrina Epley, FNP,as directed by  Katrina Epley, FNP while in the presence of Katrina Epley, FNP.  Subjective:  Patient ID: Katrina Tucker , female    DOB: January 21, 1966 , 58 y.o.   MRN: [id number]  Chief Complaint  Patient presents with   Hypertension    Patient presents today for a bp and chol follow up, Patient reports compliance with medication. Patient denies any chest pain, SOB, or headaches. Patient has no concerns today.    Hyperlipidemia   Obesity    Patient would like to start something for weight loss, she reports she works out about 2/3 days a week. She admits her diet is not the best but she does sometimes try intermitted fasting.     HPI  She is on Entresto from the Cardiologist - she has been on for 3 weeks to one month. She is not having any side effects that she can think of. She has been trying to improve her intake of salt. She is drinking approximately 72 oz water  day. She will walk 30 minutes in neighborhood 2 days a week, will do a 15 minute HIIT with [place] or strength and conditioning. She has jointed the [employer].     Past Medical History:  Diagnosis Date   Anemia    Headache    history of    Heart failure with mildly reduced ejection fraction (HFmrEF, 41-49%) (HCC) 06/23/2023   Non-ischemic cardiomyopathy  CCTA 05/13/23: CAC score 0, no CAD, ascending aorta 3.4 cm   TTE 05/13/23: EF 45-50, no RWMA, mild LVH, Gr 1 DD, GLS -15.9, mildly reduced RVSF, NL PASP, RVSP 21.7, trivial MR, trivial AI, ascending aorta 38 mm, RAP 8    Urinary tract infectious disease 08/02/2020   UTI (lower urinary tract infection)      History reviewed. No pertinent family history.   Current Outpatient Medications:    Ascorbic Acid (VITAMIN C PO), Take by mouth daily at 2 PM., Disp: , Rfl:    Cyanocobalamin (VITAMIN B-12 PO), Take 1 capsule by mouth daily at 2 am., Disp:  , Rfl:    ELDERBERRY PO, Take 1 gummie by mouth once daily, Disp: , Rfl:    ENTRESTO 24-26 MG, Take 1 tablet by mouth 2 (two) times daily., Disp: , Rfl:    Magnesium  200 MG TABS, Take 1 tablet (200 mg total) by mouth daily. Take with evening meal, Disp: 30 tablet, Rfl: 1   Multiple Vitamin (MULTIVITAMIN WITH MINERALS) TABS tablet, Take 1 tablet by mouth daily., Disp: , Rfl:    Omega-3 Fatty Acids (FISH OIL) 1000 MG CAPS, , Disp: , Rfl:    tirzepatide  (ZEPBOUND ) 2.5 MG/0.5ML Pen, Inject 2.5 mg into the skin once a week., Disp: 2 mL, Rfl: 0   VITAMIN D PO, , Disp: , Rfl:    zinc gluconate 50 MG tablet, Take 50 mg by mouth daily., Disp: , Rfl:    amLODipine  (NORVASC ) 5 MG tablet, Take 1 tablet (5 mg total) by mouth daily. (Patient not taking: Reported on 11/26/2023), Disp: 180 tablet, Rfl: 3   No Known Allergies   Review of Systems  Constitutional: Negative.   Respiratory: Negative.    Cardiovascular: Negative.   Neurological: Negative.   Psychiatric/Behavioral: Negative.       Today's Vitals   11/26/23 1439  BP: 122/80  Pulse: 91  Temp: 98.4 F (36.9 C)  TempSrc:  Oral  Weight: 196 lb (88.9 kg)  Height: 5\' 5"  (1.651 m)  PainSc: 0-No pain   Body mass index is 32.62 kg/m.  Wt Readings from Last 3 Encounters:  11/26/23 196 lb (88.9 kg)  06/23/23 191 lb 6.4 oz (86.8 kg)  05/26/23 190 lb (86.2 kg)     Objective:  Physical Exam Vitals reviewed.  Constitutional:      General: She is not in acute distress.    Appearance: Normal appearance. She is obese.  Cardiovascular:     Rate and Rhythm: Normal rate and regular rhythm.     Pulses: Normal pulses.     Heart sounds: Normal heart sounds. No murmur heard. Pulmonary:     Effort: Pulmonary effort is normal. No respiratory distress.     Breath sounds: Normal breath sounds. No wheezing.  Skin:    General: Skin is warm and dry.     Capillary Refill: Capillary refill takes less than 2 seconds.  Neurological:     General: No focal  deficit present.     Mental Status: She is alert and oriented to person, place, and time.     Cranial Nerves: No cranial nerve deficit.     Motor: No weakness.  Psychiatric:        Mood and Affect: Mood normal.        Behavior: Behavior normal.        Thought Content: Thought content normal.        Judgment: Judgment normal.      Assessment And Plan:  Essential hypertension Assessment & Plan: Blood pressure is better controlled, continue current medications.   Orders: -     BMP8+eGFR  Mixed hyperlipidemia Assessment & Plan: Cholesterol levels stable, continue low fat diet and increase fiber.   Orders: -     Lipid panel  COVID-19 vaccination declined Assessment & Plan: Declines covid 19 vaccine. Discussed risk of covid 48 and if she changes her mind about the vaccine to call the office. Education has been provided regarding the importance of this vaccine but patient still declined. Advised may receive this vaccine at local pharmacy or Health Dept.or vaccine clinic. Aware to provide a copy of the vaccination record if obtained from local pharmacy or Health Dept.  Encouraged to take multivitamin, vitamin d, vitamin c and zinc to increase immune system. Aware can call office if would like to have vaccine here at office. Verbalized acceptance and understanding.    Influenza vaccination declined Assessment & Plan: Patient declined influenza vaccination at this time. Patient is aware that influenza vaccine prevents illness in 70% of healthy people, and reduces hospitalizations to 30-70% in elderly. This vaccine is recommended annually. Education has been provided regarding the importance of this vaccine but patient still declined. Advised may receive this vaccine at local pharmacy or Health Dept.or vaccine clinic. Aware to provide a copy of the vaccination record if obtained from local pharmacy or Health Dept.  Pt is willing to accept risk associated with refusing vaccination.     Herpes zoster vaccination declined Assessment & Plan: Declines shingrix, educated on disease process and is aware if he changes his mind to notify office    Class 1 obesity due to excess calories without serious comorbidity with body mass index (BMI) of 32.0 to 32.9 in adult Assessment & Plan: She is encouraged to strive for BMI less than 30 to decrease cardiac risk. Advised to aim for at least 150 minutes of exercise per week.  She is interested  in starting a weight loss medication, will send Rx for Zepbound  2.5 mg weekly. Denies family history of medullary thyroid cancer and personal history of pancreatitis. Discussed side effects to include constipation, nausea. Encouraged to stay well hydrated with water  and can take a stool softner if constipation.   Orders: -     Zepbound ; Inject 2.5 mg into the skin once a week.  Dispense: 2 mL; Refill: 0   Return for keep same next.  Patient was given opportunity to ask questions. Patient verbalized understanding of the plan and was able to repeat key elements of the plan. All questions were answered to their satisfaction.    Inge Mangle, FNP, have reviewed all documentation for this visit. The documentation on 11/26/23 for the exam, diagnosis, procedures, and orders are all accurate and complete.   IF YOU HAVE BEEN REFERRED TO A SPECIALIST, IT MAY TAKE 1-2 WEEKS TO SCHEDULE/PROCESS THE REFERRAL. IF YOU HAVE NOT HEARD FROM US /SPECIALIST IN TWO WEEKS, PLEASE GIVE US  A CALL AT [phone].

## 2023-11-26 NOTE — Patient Instructions (Signed)
 If you have any stomach pain or difficulty swallowing call to office Zepbound may cause nausea allow time for this to improve Goal to lose 1-2 lbs per week Increase your physical activity and incorporate 2 days of strength training.

## 2023-11-27 LAB — BMP8+EGFR
BUN/Creatinine Ratio: 16 (ref 9–23)
BUN: 15 mg/dL (ref 6–24)
CO2: 24 mmol/L (ref 20–29)
Calcium: 9.4 mg/dL (ref 8.7–10.2)
Chloride: 103 mmol/L (ref 96–106)
Creatinine, Ser: 0.91 mg/dL (ref 0.57–1.00)
Glucose: 84 mg/dL (ref 70–99)
Potassium: 4.3 mmol/L (ref 3.5–5.2)
Sodium: 141 mmol/L (ref 134–144)
eGFR: 73 mL/min/{1.73_m2} (ref >59)

## 2023-11-27 LAB — LIPID PANEL
Chol/HDL Ratio: 3.9 ratio (ref 0.0–4.4)
Cholesterol, Total: 222 mg/dL — ABNORMAL HIGH (ref 100–199)
HDL: 57 mg/dL (ref >39)
LDL Chol Calc (NIH): 154 mg/dL — ABNORMAL HIGH (ref 0–99)
Triglycerides: 66 mg/dL (ref 0–149)
VLDL Cholesterol Cal: 11 mg/dL (ref 5–40)

## 2023-12-06 ENCOUNTER — Ambulatory Visit: Payer: Self-pay | Admitting: Nurse Practitioner

## 2023-12-06 DIAGNOSIS — E66811 Obesity, class 1: Secondary | ICD-10-CM | POA: Insufficient documentation

## 2023-12-06 DIAGNOSIS — Z2821 Immunization not carried out because of patient refusal: Secondary | ICD-10-CM | POA: Insufficient documentation

## 2023-12-06 NOTE — Assessment & Plan Note (Signed)

## 2023-12-06 NOTE — Assessment & Plan Note (Addendum)
 She is encouraged to strive for BMI less than 30 to decrease cardiac risk. Advised to aim for at least 150 minutes of exercise per week.  She is interested in starting a weight loss medication, will send Rx for Zepbound  2.5 mg weekly. Denies family history of medullary thyroid cancer and personal history of pancreatitis. Discussed side effects to include constipation, nausea. Encouraged to stay well hydrated with water  and can take a stool softner if constipation.

## 2023-12-06 NOTE — Assessment & Plan Note (Signed)
 Cholesterol levels stable, continue low fat diet and increase fiber.

## 2023-12-06 NOTE — Assessment & Plan Note (Signed)

## 2023-12-06 NOTE — Assessment & Plan Note (Signed)
 Declines shingrix, educated on disease process and is aware if he changes his mind to notify office

## 2023-12-06 NOTE — Assessment & Plan Note (Signed)
Blood pressure is better controlled, continue current medications

## 2024-05-26 ENCOUNTER — Encounter: Payer: Self-pay | Admitting: Nurse Practitioner

## 2024-06-28 ENCOUNTER — Encounter: Payer: Self-pay | Admitting: Family Medicine

## 2024-08-11 ENCOUNTER — Ambulatory Visit: Payer: Self-pay | Admitting: Urology

## 2024-08-11 VITALS — BP 145/89 | HR 76 | Ht 65.0 in | Wt 186.0 lb

## 2024-08-11 DIAGNOSIS — R3121 Asymptomatic microscopic hematuria: Secondary | ICD-10-CM

## 2024-08-11 DIAGNOSIS — Z87442 Personal history of urinary calculi: Secondary | ICD-10-CM

## 2024-08-11 LAB — URINALYSIS, COMPLETE
Bilirubin, UA: NEGATIVE
Glucose, UA: NEGATIVE
Leukocytes,UA: NEGATIVE
Nitrite, UA: NEGATIVE
Protein,UA: NEGATIVE
Specific Gravity, UA: 1.025 (ref 1.005–1.030)
Urobilinogen, Ur: 0.2 mg/dL (ref 0.2–1.0)
pH, UA: 5.5 (ref 5.0–7.5)

## 2024-08-11 LAB — MICROSCOPIC EXAMINATION

## 2024-08-11 NOTE — Progress Notes (Signed)
 "  08/11/24 2:05 PM   Katrina Tucker [birth year] [id number]  CC: History microscopic hematuria, urethral bleeding  HPI: 59 year old female with long-term low-grade microscopic hematuria with 3-10 RBC, negative workup previously in 2017, as well as a negative CT urogram in 2022.  She made an appointment today after noticing some blood when wiping after urination, was treated with antibiotics by GYN and bleeding resolved, culture was ultimately negative.  She denies any urinary complaints, flank pain, or or gross hematuria today.  Urinalysis with stable 3-10 RBC.   PMH: Past Medical History:  Diagnosis Date   Anemia    Headache    history of    Heart failure with mildly reduced ejection fraction (HFmrEF, 41-49%) (HCC) 06/23/2023   Non-ischemic cardiomyopathy  CCTA 05/13/23: CAC score 0, no CAD, ascending aorta 3.4 cm   TTE 05/13/23: EF 45-50, no RWMA, mild LVH, Gr 1 DD, GLS -15.9, mildly reduced RVSF, NL PASP, RVSP 21.7, trivial MR, trivial AI, ascending aorta 38 mm, RAP 8    Urinary tract infectious disease 08/02/2020   UTI (lower urinary tract infection)     Surgical History: Past Surgical History:  Procedure Laterality Date   ABDOMINAL HYSTERECTOMY Bilateral 08/20/2015   Procedure: TOTAL ABDOMINAL HYSTERECTOMY ;  Surgeon: Rome Rigg, MD;  Location: [facility];  Service: Gynecology;  Laterality: Bilateral;   BILATERAL SALPINGECTOMY Bilateral 08/20/2015   Procedure: BILATERAL SALPINGECTOMY;  Surgeon: Rome Rigg, MD;  Location: [facility];  Service: Gynecology;  Laterality: Bilateral;   BREAST SURGERY     biopsy feb 2014 left breast 2000 right breast    CYSTOSCOPY  08/20/2015   Procedure: CYSTOSCOPY;  Surgeon: Rome Rigg, MD;  Location: [facility];  Service: Gynecology;;   MOUTH SURGERY        Family History: No family history on file.  Social History:  reports that she has never smoked. She has been exposed to tobacco smoke. She has never used smokeless tobacco. She reports  current alcohol use of about 7.0 - 14.0 standard drinks of alcohol per week. She reports that she does not use drugs.  Physical Exam: BP (!) 145/89 (BP Location: Left Arm, Patient Position: Sitting, Cuff Size: Normal)   Pulse 76   Ht 5' 5 (1.651 m)   Wt 186 lb (84.4 kg)   LMP 07/26/2015   SpO2 96%   BMI 30.95 kg/m    Constitutional:  Alert and oriented, No acute distress. Cardiovascular: No clubbing, cyanosis, or edema. Respiratory: Normal respiratory effort, no increased work of breathing. GI: Abdomen is soft, nontender, nondistended, no abdominal masses   Laboratory Data: Reviewed, see HPI   Pertinent Imaging: CT urogram 2022 benign  Assessment & Plan:   59 year old female with chronic low-grade microscopic hematuria, prior negative workup x 2 who presents with a few episodes of blood on tissue after wiping, suspected to be GU in nature.  Was treated with antibiotics by PCP and bleeding resolved.  Has had no problems recently.  Urinalysis today with stable low-grade microscopic hematuria 3-10 RBC.  We had a long conversation about options including observation, CT urogram, or CT and cystoscopy.  Risks and benefits were reviewed extensively, reassurance was provided regarding normal CT within the last few years, as well as history of normal cystoscopy.  Urinalysis stable from prior.  Ultimately, using shared decision making she opted to defer cystoscopy and CT, return precautions were discussed extensively, if recurrent gross hematuria or greater than 20 RBC would recommend repeating microscopic hematuria workup with CT and  cystoscopy   Redell Burnet, MD 08/11/2024  [facility] [address], [address] [place], [place] [address] [phone]   "

## 2024-09-14 ENCOUNTER — Encounter: Payer: Self-pay | Admitting: Nurse Practitioner

## 2024-09-15 ENCOUNTER — Encounter: Payer: Self-pay | Admitting: Nurse Practitioner
